# Patient Record
Sex: Female | Born: 1945 | Race: Black or African American | Hispanic: No | Smoking: Never smoker
Health system: Southern US, Community
[De-identification: ages and names within clinical notes are randomized; demographics above are authoritative.]

## PROBLEM LIST (undated history)

## (undated) DIAGNOSIS — I4891 Unspecified atrial fibrillation: Secondary | ICD-10-CM

## (undated) DIAGNOSIS — I10 Essential (primary) hypertension: Secondary | ICD-10-CM

## (undated) DIAGNOSIS — I472 Ventricular tachycardia: Secondary | ICD-10-CM

## (undated) DIAGNOSIS — I499 Cardiac arrhythmia, unspecified: Secondary | ICD-10-CM

## (undated) DIAGNOSIS — I4729 Other ventricular tachycardia: Secondary | ICD-10-CM

## (undated) HISTORY — PX: TONSILLECTOMY: SUR1361

## (undated) HISTORY — PX: UTERINE SUSPENSION: SUR1430

## (undated) HISTORY — PX: BREAST SURGERY: SHX581

## (undated) HISTORY — PX: DILATION AND CURETTAGE OF UTERUS: SHX78

## (undated) HISTORY — PX: ANTERIOR AND POSTERIOR REPAIR: SHX1172

## (undated) HISTORY — DX: Essential (primary) hypertension: I10

## (undated) HISTORY — PX: CARDIAC CATHETERIZATION: SHX172

## (undated) HISTORY — PX: BILATERAL OOPHORECTOMY: SHX1221

## (undated) HISTORY — PX: COLONOSCOPY: SHX174

## (undated) HISTORY — PX: ABDOMINAL HYSTERECTOMY: SHX81

## (undated) HISTORY — PX: GREAT TOE ARTHRODESIS, INTERPHALANGEAL JOINT: SUR55

## (undated) SURGERY — CARDIOVERSION
Anesthesia: Monitor Anesthesia Care

---

## 1999-10-26 HISTORY — PX: ATRIAL ABLATION SURGERY: SHX560

## 2002-07-16 ENCOUNTER — Encounter: Admission: RE | Admit: 2002-07-16 | Discharge: 2002-07-16 | Payer: Self-pay | Admitting: Family Medicine

## 2002-07-16 ENCOUNTER — Encounter: Payer: Self-pay | Admitting: Family Medicine

## 2002-09-14 ENCOUNTER — Ambulatory Visit (HOSPITAL_COMMUNITY): Admission: RE | Admit: 2002-09-14 | Discharge: 2002-09-14 | Payer: Self-pay | Admitting: Family Medicine

## 2002-09-14 ENCOUNTER — Encounter: Payer: Self-pay | Admitting: Family Medicine

## 2003-01-30 ENCOUNTER — Emergency Department (HOSPITAL_COMMUNITY): Admission: EM | Admit: 2003-01-30 | Discharge: 2003-01-30 | Payer: Self-pay | Admitting: Emergency Medicine

## 2003-05-01 ENCOUNTER — Encounter: Admission: RE | Admit: 2003-05-01 | Discharge: 2003-07-30 | Payer: Self-pay | Admitting: Orthopedic Surgery

## 2004-08-18 ENCOUNTER — Ambulatory Visit (HOSPITAL_COMMUNITY): Admission: RE | Admit: 2004-08-18 | Discharge: 2004-08-18 | Payer: Self-pay | Admitting: Obstetrics and Gynecology

## 2004-12-07 ENCOUNTER — Emergency Department (HOSPITAL_COMMUNITY): Admission: EM | Admit: 2004-12-07 | Discharge: 2004-12-08 | Payer: Self-pay | Admitting: Emergency Medicine

## 2005-01-13 ENCOUNTER — Ambulatory Visit (HOSPITAL_COMMUNITY): Admission: RE | Admit: 2005-01-13 | Discharge: 2005-01-13 | Payer: Self-pay | Admitting: Gastroenterology

## 2005-08-24 ENCOUNTER — Ambulatory Visit (HOSPITAL_COMMUNITY): Admission: RE | Admit: 2005-08-24 | Discharge: 2005-08-24 | Payer: Self-pay | Admitting: Obstetrics and Gynecology

## 2006-08-25 ENCOUNTER — Ambulatory Visit (HOSPITAL_COMMUNITY): Admission: RE | Admit: 2006-08-25 | Discharge: 2006-08-25 | Payer: Self-pay | Admitting: Obstetrics and Gynecology

## 2006-09-12 ENCOUNTER — Encounter: Admission: RE | Admit: 2006-09-12 | Discharge: 2006-09-12 | Payer: Self-pay | Admitting: Cardiology

## 2006-09-19 ENCOUNTER — Ambulatory Visit (HOSPITAL_COMMUNITY): Admission: RE | Admit: 2006-09-19 | Discharge: 2006-09-19 | Payer: Self-pay | Admitting: Cardiology

## 2007-08-28 ENCOUNTER — Ambulatory Visit (HOSPITAL_COMMUNITY): Admission: RE | Admit: 2007-08-28 | Discharge: 2007-08-28 | Payer: Self-pay | Admitting: Obstetrics and Gynecology

## 2008-08-28 ENCOUNTER — Ambulatory Visit (HOSPITAL_COMMUNITY): Admission: RE | Admit: 2008-08-28 | Discharge: 2008-08-28 | Payer: Self-pay | Admitting: Obstetrics and Gynecology

## 2009-02-17 ENCOUNTER — Encounter: Admission: RE | Admit: 2009-02-17 | Discharge: 2009-02-17 | Payer: Self-pay | Admitting: Obstetrics and Gynecology

## 2009-09-04 ENCOUNTER — Ambulatory Visit (HOSPITAL_COMMUNITY): Admission: RE | Admit: 2009-09-04 | Discharge: 2009-09-04 | Payer: Self-pay | Admitting: Obstetrics and Gynecology

## 2010-02-03 ENCOUNTER — Encounter (INDEPENDENT_AMBULATORY_CARE_PROVIDER_SITE_OTHER): Payer: Self-pay | Admitting: Cardiology

## 2010-02-03 ENCOUNTER — Ambulatory Visit: Admission: RE | Admit: 2010-02-03 | Discharge: 2010-02-03 | Payer: Self-pay | Admitting: Cardiology

## 2010-04-24 ENCOUNTER — Ambulatory Visit (HOSPITAL_COMMUNITY): Admission: RE | Admit: 2010-04-24 | Discharge: 2010-04-24 | Payer: Self-pay | Admitting: Obstetrics and Gynecology

## 2010-06-08 ENCOUNTER — Ambulatory Visit (HOSPITAL_COMMUNITY): Admission: RE | Admit: 2010-06-08 | Discharge: 2010-06-08 | Payer: Self-pay | Admitting: Gastroenterology

## 2010-07-03 ENCOUNTER — Ambulatory Visit (HOSPITAL_COMMUNITY): Admission: RE | Admit: 2010-07-03 | Discharge: 2010-07-03 | Payer: Self-pay | Admitting: Gastroenterology

## 2010-09-07 ENCOUNTER — Ambulatory Visit (HOSPITAL_COMMUNITY): Admission: RE | Admit: 2010-09-07 | Discharge: 2010-09-07 | Payer: Self-pay | Admitting: Obstetrics and Gynecology

## 2010-11-15 ENCOUNTER — Encounter: Payer: Self-pay | Admitting: Orthopaedic Surgery

## 2010-11-16 ENCOUNTER — Encounter: Payer: Self-pay | Admitting: Orthopaedic Surgery

## 2011-02-04 ENCOUNTER — Other Ambulatory Visit (HOSPITAL_COMMUNITY): Payer: Self-pay | Admitting: Cardiology

## 2011-02-04 DIAGNOSIS — R0602 Shortness of breath: Secondary | ICD-10-CM

## 2011-03-12 NOTE — Op Note (Signed)
Amy Adkins, MAGAR             ACCOUNT NO.:  [id number]   MEDICAL RECORD NO.:  [id number]          PATIENT TYPE:  AMB   LOCATION:  ENDO                         FACILITY:  [facility]   PHYSICIAN:  Anselmo Rod, M.D.  DATE OF BIRTH:  10-Feb-1946   DATE OF PROCEDURE:  01/13/2005  DATE OF DISCHARGE:                                 OPERATIVE REPORT   PROCEDURE PERFORMED:  Screening colonoscopy.   ENDOSCOPIST:  Anselmo Rod, M.D.   INSTRUMENT USED:  Olympus video colonoscope.   INDICATION FOR PROCEDURE:  A 65 year old African-American female underwent  screening colonoscopy for family history of colon cancer in her sister.  Rule out colonic polyps, masses, etc.   PREPROCEDURE PREPARATION:  Informed consent was procured from the patient  and the patient was fasted for eight hours prior to the procedure and  prepped with a bottle of magnesium citrate and a gallon of GoLYTELY the  night prior to the procedure.  The risks and benefits of the procedure  including a 10% miss rate of cancer and polyps were discussed with the  patient, as well.   PREPROCEDURE PHYSICAL:  The patient had stable vital signs.  Neck supple.  Chest clear to auscultation.  S1, S2 regular.  Abdomen soft with normal  bowel sounds.   DESCRIPTION OF PROCEDURE:  The patient was placed in the left lateral  decubitus position, sedated with 100 mg of Demerol and 10 mg of Versed in  slow incremental doses.  Once the patient was adequately sedated, maintained  on low-flow oxygen and continuous cardiac monitoring, the Olympus video  colonoscope was advanced from the rectum to the cecum.  The appendical  orifice and the cecal valve were clearly visualized and photographed.  No  masses, polyps, erosions, ulcerations, or diverticula were seen.  Small  internal hemorrhoids were appreciated on retroflexion of the rectum.  The  patient tolerated the procedure well without immediate complications.   IMPRESSION:  1.  Normal  colonoscopy to the cecum except for small nonbleeding internal      hemorrhoids seen on retroflexion.  2.  No masses, polyps, erosions, ulcerations, or diverticula seen.   RECOMMENDATIONS:  1.  Given the patient's family history of colon cancer in a first-degree      relative, a repeat colonoscopy has been      recommended in the next five years unless then patient develops any      abnormal symptoms in the interim.  2.  Outpatient follow-up as need arises in the future.  3.  Continue a high fiber diet as previously recommended.      JNM/MEDQ  D:  01/13/2005  T:  01/13/2005  Job:  [id number]   cc:   Maxie Better, M.D.  [address]  [place]  [place] [address]  Fax: [phone]

## 2011-03-12 NOTE — Cardiovascular Report (Signed)
NAMEHENESSY, ROHRER               ACCOUNT NO.:  [id number]   MEDICAL RECORD NO.:  [id number]          PATIENT TYPE:  OIB   LOCATION:  2899                         FACILITY:  [facility]   PHYSICIAN:  Armanda Magic, M.D.     DATE OF BIRTH:  [birth year]   DATE OF PROCEDURE:  09/19/2006  DATE OF DISCHARGE:                            CARDIAC CATHETERIZATION   PROCEDURE:  1. Left heart catheterization.  2. Coronary angiography.  3. Left ventriculography.   OPERATOR:  Armanda Magic, M.D.   INDICATIONS:  1. Dyspnea which is her anginal equivalent.  2. Abnormal Cardiolite.   COMPLICATIONS:  None.   IV ACCESS:  Via right femoral artery, 6-French sheath.   This is a very pleasant 65 year old black female, with a history of  hypertensive cardiomyopathy and AV node ablation in the past and  hypertension, now presents for cardiac catheterization due to dyspnea on  exertion, which I think may be an anginal equivalent, and an abnormal  Cardiolite.   The patient is brought to the cardiac catheterization laboratory in a  fasting nonsedated state.  Informed consent was obtained.  The patient  was connected to continuous heart rate and pulse oximetry monitoring and  intermittent blood pressure monitoring.  The right groin was prepped and  draped in a sterile fashion.  One percent Xylocaine was used for local  anesthesia.  Using modified Seldinger technique, a 6-French sheath was  placed in right femoral artery.  Under fluoroscopic guidance a 6-French  JL-4 catheter was placed in the left coronary artery.  Multiple cine  films were taken at 30 degree RAO and 40 degree LAO views.  Catheter was  then exchanged out over a guidewire for a 6-French JR-4 catheter which  was placed under fluoroscopic guidance into the right coronary artery.  Multiple cine films were taken at 30 degree RAO and 40 degree LAO views.  This catheter was exchanged out over a guidewire for a 6-French angled  pigtail catheter  which was placed under fluoroscopic guidance into the  left ventricular cavity.  Left ventriculography was performed in the 30  degree RAO view using total of 30 mL of contrast at 15 mL per second.  Catheter was then pulled back across the aortic valve with no  significant gradient noted.  At the end of the procedure, all catheters  and sheaths were removed.  Manual compression was performed until  adequate hemostasis was obtained.  The patient was transferred back to  the room in stable condition.   RESULTS:  Left main coronary artery is widely patent and trifurcates  into the left anterior descending artery, left circumflex artery and  ramus branch.   Left anterior descending artery is widely patent throughout its course,  the apex giving rise to a first diagonal branch which is widely patent.   The ramus branch is large and widely patent throughout its course  distally trifurcating at 3 daughter branches, all of which are widely  patent.   Left circumflex is widely patent throughout its course in the AV groove  giving rise to a first small obtuse marginal branch,  which is widely  patent, and then gives rise to a large second obtuse marginal branch  which trifurcates into 3 daughter branches, all of which are widely  patent.   The right coronary artery is widely patent and bifurcates distally into  a posterior descending artery and posterior lateral artery, both of  which are widely patent.   Left ventriculography shows normal LV systolic function at 55%. Left  ventricular and aortic pressures as well as LVEDP were inaccurate  because of significant amount of PVCs.   ASSESSMENT:  1. Normal coronary arteries.  2. Normal left ventricular function.  3. Noncardiac chest pain, question possibly an arrhythmia causing her      shortness of breath.  She did have several episodes of what      appeared to be an atrial tachycardia or supraventricular      tachycardia during the  procedure.   PLAN:  1. Discharge to home after IV fluid and bedrest.  2. Follow-up with me in 3 to 4 weeks for groin check.  3. Will get an outpatient event monitor to see if she is having SVT      that correlates with her shortness of breath.      Armanda Magic, M.D.  Electronically Signed     TT/MEDQ  D:  09/19/2006  T:  09/19/2006  Job:  [id number]   cc:   Maxie Better, M.D.

## 2011-03-12 NOTE — H&P (Signed)
NAME:  Amy Adkins, Amy Adkins                 ACCOUNT NO.:  griff   MEDICAL RECORD NO.:  [id number]          PATIENT TYPE:  OIB   LOCATION:  2899                         FACILITY:  [facility]   PHYSICIAN:  Tylene Fantasia, PA    DATE OF BIRTH:  Nov 12, 1945   DATE OF ADMISSION:  09/19/2006  DATE OF DISCHARGE:                                HISTORY & PHYSICAL   CHIEF COMPLAINT:  Chest tightness.   HISTORY OF PRESENT ILLNESS:  The patient  is a 65 year old African American  female with a known history of palpitations with PVC'S and capsule PAC'S on  Holter monitor, hypertension, prior AV nodal ablation, and normal coronary  arteries by prior catheterization 7 years ago.  She presented to the [facility]  cardiology office for a stress Cardiolite secondary to chest tightness.  Stress Cardiolite revealed questionable septal ischemia at the base versus  breast attenuation artifact.  She is being admitted today as an outpatient  for diagnostic cardiac cath with possible PCI.  She denies chest tightness,  chest pain, shortness of breath or diaphoresis.   PAST MEDICAL HISTORY:  1. Normal coronary arteries by prior catheterization 7 years ago.  2. Hypertension.  3. History of prior AV nodal abrasion.  4. History of palpitations with PVC's and PAC 's on Holter monitor.  5. Hypertensive cardiomyopathy, EF normalized at 61% by MUGA in 2005.  6. Mild LV dysfunction with EF of 43% via 2-D echocardiogram 08/2006.  7. Chronic shortness of breath of unclear etiology.   ALLERGIES:  No known drug allergies.   CURRENT MEDICATIONS:  1. Aspirin 81 mg daily.  2. Lasix 20 mg daily.  3. Premarin 0.45 mg daily.  4. Calcium 1200 mg daily.  5. Zinc 1 tablet daily.  6. Omega 3 fatty acids 1 tablet twice daily.  7. Zestril 20 mg daily.  8. Toprol XL 50 mg daily.   FAMILY HISTORY:  1. Father deceased age 72, MI secondary to a gunshot wound.  2. Mother deceased age 57, hypertension, CABG, ulcerative colitis with  multi system organ failure.   SOCIAL HISTORY:  The patient denies tobacco or illicit drug use.  She admits  to occasional alcohol use.   REVIEW OF SYSTEMS:  All other systems reviewed are negative other than what  is stated in the HPI.   PHYSICAL EXAMINATION:  GENERAL:  A 65 year old female, anxious appearing but  pleasant and cooperative, NAD.  VITALS:  Temperature 96.9, pulse 62, blood pressure 122/67, O2 saturations  99% over room air.  Height 5 feet 6 inches, weight 140 pounds.  HEENT: Unremarkable.  NECK:  Supple without JVD or carotid bruits, bilaterally.  PULMONARY:  Breath sounds are equal and clear to auscultation, bilaterally.  No use accessory muscles.  CV: Regular rate and rhythm.  Normal S1 and S2 without murmurs, gallops,  clicks or rubs.  ABDOMEN:  Soft, nontender, nondistended with active bowel  sounds.  No masses, organomegaly, or bilateral bruits.  EXTREMITIES:  No edema.  DP, PT, and femoral pulses 2+/2, bilaterally.  SKIN:  Warm and dry without rashes or lesions.  PSYCH:  Normal mood and affect.  NEURO:  No focal motor or sensory deficits.   ASSESSMENT AND PLAN:  1. Chest tightness, resolved.  2. Abnormal stress Cardiolite in 08/2006.  3. Diagnostic cardiac catheterization with possible PCI.  4. The cardiac catheterization procedure, risks, and potential      complications have been explained by Dr. Mayford Knife including MI, CVA,      death, renal insufficiency, dye allergy, and vascular complications.      The patient understands and wishes to proceed.      Tylene Fantasia, [place]     RDM/MEDQ  D:  09/19/2006  T:  09/19/2006  Job:  [id number]   cc:   Gretta Arab. Valentina Lucks, M.D.  Maxie Better, M.D.

## 2011-03-26 ENCOUNTER — Other Ambulatory Visit (HOSPITAL_COMMUNITY): Payer: Self-pay

## 2011-04-06 ENCOUNTER — Encounter (HOSPITAL_COMMUNITY)
Admission: RE | Admit: 2011-04-06 | Discharge: 2011-04-06 | Disposition: A | Discharge status: Home / Self Care | Payer: 59 | Source: Ambulatory Visit | Attending: Cardiology | Admitting: Cardiology

## 2011-04-06 ENCOUNTER — Ambulatory Visit (HOSPITAL_COMMUNITY): Payer: Self-pay

## 2011-04-06 DIAGNOSIS — R0602 Shortness of breath: Secondary | ICD-10-CM | POA: Insufficient documentation

## 2011-04-06 MED ORDER — TECHNETIUM TC 99M TETROFOSMIN IV KIT
10.0000 | PACK | Freq: Once | INTRAVENOUS | Status: AC | PRN
  Administered 2011-04-06: 10 via INTRAVENOUS

## 2011-04-06 MED ORDER — TECHNETIUM TC 99M TETROFOSMIN IV KIT
30.0000 | PACK | Freq: Once | INTRAVENOUS | Status: AC | PRN
  Administered 2011-04-06: 30 via INTRAVENOUS

## 2011-04-13 ENCOUNTER — Other Ambulatory Visit (HOSPITAL_COMMUNITY): Payer: Self-pay | Admitting: Cardiology

## 2011-04-13 ENCOUNTER — Encounter: Payer: Self-pay | Admitting: Cardiology

## 2011-04-13 DIAGNOSIS — R9439 Abnormal result of other cardiovascular function study: Secondary | ICD-10-CM

## 2011-04-14 ENCOUNTER — Telehealth: Payer: Self-pay | Admitting: Cardiology

## 2011-04-14 NOTE — Telephone Encounter (Signed)
[name] from Dr. Mayford Knife office called and stated that Amy Adkins does not want to proceed with CTA at this time.

## 2011-04-19 ENCOUNTER — Other Ambulatory Visit (HOSPITAL_COMMUNITY): Payer: Self-pay

## 2011-04-21 ENCOUNTER — Inpatient Hospital Stay (HOSPITAL_COMMUNITY): Admission: RE | Admit: 2011-04-21 | Payer: 59 | Source: Ambulatory Visit

## 2011-05-05 ENCOUNTER — Encounter: Payer: Self-pay | Admitting: Cardiology

## 2011-05-06 ENCOUNTER — Ambulatory Visit (INDEPENDENT_AMBULATORY_CARE_PROVIDER_SITE_OTHER): Payer: 59 | Admitting: Cardiology

## 2011-05-06 ENCOUNTER — Encounter: Payer: Self-pay | Admitting: Cardiology

## 2011-05-06 VITALS — BP 124/80 | HR 72 | Resp 14 | Ht 66.0 in | Wt 162.0 lb

## 2011-05-06 DIAGNOSIS — R0600 Dyspnea, unspecified: Secondary | ICD-10-CM | POA: Insufficient documentation

## 2011-05-06 DIAGNOSIS — I119 Hypertensive heart disease without heart failure: Secondary | ICD-10-CM | POA: Insufficient documentation

## 2011-05-06 DIAGNOSIS — R0602 Shortness of breath: Secondary | ICD-10-CM

## 2011-05-06 DIAGNOSIS — R079 Chest pain, unspecified: Secondary | ICD-10-CM | POA: Insufficient documentation

## 2011-05-06 DIAGNOSIS — I428 Other cardiomyopathies: Secondary | ICD-10-CM

## 2011-05-06 MED ORDER — CARVEDILOL 6.25 MG PO TABS: 6.2500 mg | ORAL_TABLET | Freq: Two times a day (BID) | ORAL | Status: DC

## 2011-05-06 NOTE — Assessment & Plan Note (Signed)
In retrospect, I suspect this is due to long-standing hypertension. The fact that her blood pressure is good today and the fact that her left ventricular function has not declined over the past 5 years is encouraging. The low ejection fraction on the nuclear study is probably falsely low.  I do not think she needs a cardiac catheterization at the present time. The findings on the 2 nuclear studies in 2007 2012 are not totally dissimilar. Her symptoms also are not consistent with ischemia or angina.  I think the dyspnea which is episodic and usually at rest is probably related to premature beats and/or anxiety. We had long talk about this today I think she understands this. I want to make sure she is good blood pressure control. Also think carvedilol would be a better long-term beta blocker for preventing the onset of heart failure. I have changed her to carvedilol 6.25 mg p.o. B.i.d.. She will check her blood  Pressure various times a day and bring those recordings to [place] in about 4-6 weeks. We will make further recommendations at that time.

## 2011-05-06 NOTE — Patient Instructions (Signed)
Your physician has recommended you make the following change in your medication: Start Carvedilol  Take 1/2 the dose of your Toprol ( Metoprolol) today, tomorrow, and Saturday.  Then discontinue use.   Your physician recommends that you schedule a follow-up appointment in: 6 weeks with Dr. Daleen Squibb  Your physician has requested that you check your blood pressure 3 times a week at various times of the day and  record your blood pressure readings at home. Please use the same machine at the same time of day to check your readings and record them to bring to your follow-up visit.

## 2011-05-06 NOTE — Progress Notes (Signed)
HPI Amy Adkins is a delightful 65 year old African American female referred by Dr. Cherly Hensen for a second opinion concerning shortness or breath or dyspnea, chest discomfort, and hypertension.  She has had hypertension for at least 20 years. She's had a history of chest discomfort in the past and 2007 had a stress nuclear study that showed a basal inferior reversible change. Her ejection fraction at that time by echocardiography was 45%. She underwent cardiac catheterization which showed normal coronary arteries.  She's been having chest discomfort at rest. It is over her left breast. It does not radiate. It is not associated with any other symptoms. It is clearly not exertion related.  She also has episodes of not being able to catch her breath for periods of time. Generally this is seconds to at most a minute or so. It is usually at rest. It is not associated with exertion though climbing a flight of steps can get her winded. She does not have any associated other symptoms with it including nausea, vomiting, diaphoresis. At times she'll fill her heart skip a beat and she will fill a need to take a deep breath. She's had a Holter monitor and the past that shows PVCs and PACs.  She denies any edema or fluid retention ever.  She recently had another stress nuclear study because of the above symptoms which showed an ejection fraction of 35% with suspected scarring in the septum with 11% reduction in activity and one of the septal segments with mild septal dyskinesia with diffuse hypokinesia. The was no obvious ischemia. Comparing the 2 scans they look very similar at least by the way they were interpreted. I do not have the original images.  She was advised to have another catheterization. She declined and asked for second opinion. He  Of note, she had an echocardiogram in April of this year. It shows an ejection fraction of 45-50% with akinesia of the basal anteroseptal myocardium. Left ventricular  chamber size was 49 mm which is normal. Left ventricular end-diastolic size was 44 mm which is slightly increased. There was no significant valvular heart disease with only trivial mitral regurgitation. Right-sided structures and function were normal.  An EKG today is identical to one of 2007 as well as one and was most recently done in April of this year. It shows normal sinus rhythm with nonspecific ST-T wave changes mostly in the inferolateral leads. Chest slight conduction delay in V2.  I spent 45 minutes reviewing the records and another 15 minutes discussing these with the patient.   Past Medical History  Diagnosis Date  . Hypertension   . Palpitations     No past surgical history on file.  Family History  Problem Relation Age of Onset  . Heart attack Father 19    deceased  . Hypertension Mother 60    CAB multi system organ failure    History   Social History  . Marital Status: Divorced    Spouse Name: N/A    Number of Children: N/A  . Years of Education: N/A   Occupational History  . Not on file.   Social History Main Topics  . Smoking status: Never Smoker   . Smokeless tobacco: Not on file  . Alcohol Use: No     occasional  . Drug Use: No  . Sexually Active: Not on file   Other Topics Concern  . Not on file   Social History Narrative  . No narrative on file    No Known  Allergies  Current Outpatient Prescriptions  Medication Sig Dispense Refill  . aspirin 81 MG tablet Take 81 mg by mouth daily.        Marland Kitchen estrogens, conjugated, (PREMARIN) 0.45 MG tablet 1 tab daily for 21 days then do not take for 7 days.      Marland Kitchen lisinopril (PRINIVIL,ZESTRIL) 20 MG tablet Take 20 mg by mouth daily.        . metoprolol (TOPROL-XL) 50 MG 24 hr tablet Take 50 mg by mouth daily.        . Multiple Vitamin (MULTIVITAMIN) tablet Take 1 tablet by mouth daily.          ROS Negative other than HPI.   PE General Appearance: well developed, well nourished in no acute  distress HEENT: symmetrical face, PERRLA, good dentition  Neck: no JVD, thyromegaly, or adenopathy, trachea midline Chest: symmetric without deformity Cardiac: PMI non-displaced, RRR, normal S1, S2, no gallop or murmur Lung: clear to ausculation and percussion Vascular: all pulses full without bruits  Abdominal: nondistended, nontender, good bowel sounds, no HSM, no bruits Extremities: no cyanosis, clubbing or edema, no sign of DVT, no varicosities  Skin: normal color, no rashes Neuro: alert and oriented x 3, non-focal Pysch: normal affect Filed Vitals:   05/06/11 1412  BP: 124/80  Pulse: 72  Resp: 14  Height: 5\' 6"  (1.676 m)  Weight: 162 lb (73.483 kg)    EKG  Labs and Studies Reviewed.   No results found for this basename: WBC, HGB, HCT, MCV, PLT      Chemistry   No results found for this basename: NA, K, CL, CO2, BUN, CREATININE, GLU   No results found for this basename: CALCIUM, ALKPHOS, AST, ALT, BILITOT       No results found for this basename: CHOL   No results found for this basename: HDL   No results found for this basename: LDLCALC   No results found for this basename: TRIG   No results found for this basename: CHOLHDL   No results found for this basename: HGBA1C   No results found for this basename: ALT, AST, GGT, ALKPHOS, BILITOT   No results found for this basename: TSH

## 2011-05-21 ENCOUNTER — Encounter: Payer: Self-pay | Admitting: Cardiology

## 2011-05-27 ENCOUNTER — Encounter: Payer: Self-pay | Admitting: Cardiology

## 2011-06-17 ENCOUNTER — Encounter: Payer: Self-pay | Admitting: Cardiology

## 2011-06-17 ENCOUNTER — Ambulatory Visit (INDEPENDENT_AMBULATORY_CARE_PROVIDER_SITE_OTHER): Payer: 59 | Admitting: Cardiology

## 2011-06-17 VITALS — BP 139/83 | HR 62 | Ht 66.0 in | Wt 162.8 lb

## 2011-06-17 DIAGNOSIS — I119 Hypertensive heart disease without heart failure: Secondary | ICD-10-CM

## 2011-06-17 DIAGNOSIS — I428 Other cardiomyopathies: Secondary | ICD-10-CM

## 2011-06-17 DIAGNOSIS — R002 Palpitations: Secondary | ICD-10-CM | POA: Insufficient documentation

## 2011-06-17 MED ORDER — CARVEDILOL 12.5 MG PO TABS: 12.5000 mg | ORAL_TABLET | Freq: Two times a day (BID) | ORAL | Status: DC

## 2011-06-17 NOTE — Assessment & Plan Note (Signed)
I will increase her carvedilol 12/2 mg twice a day. I suspect this will help lower her pressure further and hopefully decrease her palpitations. I made it clear to her that we would most likely not get rid of all of them. She is  Understanding of this. I will see her back in 6 weeks.

## 2011-06-17 NOTE — Patient Instructions (Signed)
Your physician has recommended you make the following change in your medication:   Increase Carvedilol  Your physician recommends that you schedule a follow-up appointment in: 6-8 weeks with Dr. Daleen Squibb

## 2011-06-17 NOTE — Progress Notes (Signed)
HPI Amy Adkins comes in for a close followup of her history of hypertension, palpitations, episodic dyspnea, and hypertensive cardiomyopathy.  Please refer to my previous initial consultation.  I placed her on carvedilol 6.25 b.i.d. Her blood pressures have been running about 130/70 at home heart rate running in the mid 60s to low 70s. He still has occasional palpitations and also the spells where she feels briefly lightheaded. She has not had syncope or presyncope. She's had no angina.  She has a history of SVT and had an ablation in 2001 in [place]. Symptoms she is having at present is nothing like that.  Her EKG today shows normal sinus rhythm, normal intervals, and LVH. No significant change. Past Medical History  Diagnosis Date  . Hypertension   . Palpitations     No past surgical history on file.  Family History  Problem Relation Age of Onset  . Heart attack Father 56    deceased  . Hypertension Mother 79    CAB multi system organ failure    History   Social History  . Marital Status: Divorced    Spouse Name: N/A    Number of Children: N/A  . Years of Education: N/A   Occupational History  . Not on file.   Social History Main Topics  . Smoking status: Never Smoker   . Smokeless tobacco: Not on file  . Alcohol Use: No     occasional  . Drug Use: No  . Sexually Active: Not on file   Other Topics Concern  . Not on file   Social History Narrative  . No narrative on file    No Known Allergies  Current Outpatient Prescriptions  Medication Sig Dispense Refill  . aspirin 81 MG tablet Take 81 mg by mouth daily.        . carvedilol (COREG) 6.25 MG tablet Take 1 tablet (6.25 mg total) by mouth 2 (two) times daily with a meal.  180 tablet  3  . estrogens, conjugated, (PREMARIN) 0.45 MG tablet Take 0.45 mg by mouth daily.       Marland Kitchen lisinopril (PRINIVIL,ZESTRIL) 20 MG tablet Take 20 mg by mouth daily.        . Multiple Vitamin (MULTIVITAMIN) tablet Take 1 tablet  by mouth daily.          ROS Negative other than HPI.   PE No change. Filed Vitals:   06/17/11 1608  BP: 139/83  Pulse: 62  Height: 5\' 6"  (1.676 m)  Weight: 162 lb 12.8 oz (73.846 kg)    EKG  Labs and Studies Reviewed.   No results found for this basename: WBC, HGB, HCT, MCV, PLT      Chemistry   No results found for this basename: NA, K, CL, CO2, BUN, CREATININE, GLU   No results found for this basename: CALCIUM, ALKPHOS, AST, ALT, BILITOT       No results found for this basename: CHOL   No results found for this basename: HDL   No results found for this basename: LDLCALC   No results found for this basename: TRIG   No results found for this basename: CHOLHDL   No results found for this basename: HGBA1C   No results found for this basename: ALT, AST, GGT, ALKPHOS, BILITOT   No results found for this basename: TSH

## 2011-07-12 ENCOUNTER — Other Ambulatory Visit (HOSPITAL_COMMUNITY): Payer: Self-pay | Admitting: Obstetrics and Gynecology

## 2011-07-12 DIAGNOSIS — Z1231 Encounter for screening mammogram for malignant neoplasm of breast: Secondary | ICD-10-CM

## 2011-08-16 ENCOUNTER — Ambulatory Visit: Payer: 59 | Admitting: Cardiology

## 2011-09-09 ENCOUNTER — Ambulatory Visit (HOSPITAL_COMMUNITY)
Admission: RE | Admit: 2011-09-09 | Discharge: 2011-09-09 | Disposition: A | Discharge status: Home / Self Care | Payer: 59 | Source: Ambulatory Visit | Attending: Obstetrics and Gynecology | Admitting: Obstetrics and Gynecology

## 2011-09-09 DIAGNOSIS — Z1231 Encounter for screening mammogram for malignant neoplasm of breast: Secondary | ICD-10-CM | POA: Insufficient documentation

## 2011-10-12 ENCOUNTER — Telehealth: Payer: Self-pay | Admitting: Cardiology

## 2011-10-12 NOTE — Telephone Encounter (Signed)
Pt needs a refill on lisinopril 20mg  qd called into [place] [facility] at [facility]

## 2011-10-12 NOTE — Telephone Encounter (Signed)
New msg Pt said she is having chest discomfort under her left breast sent to triage

## 2011-10-12 NOTE — Telephone Encounter (Signed)
Pt insisted on seeing Dr wall tomorrow.  Advised pt that if she continues to have chest pain she should go the the Emergency Room.  Pt stated that she would not go to the ER but see Dr. Daleen Squibb tomorrow instead.  Appointment made.

## 2011-10-13 ENCOUNTER — Encounter: Payer: Self-pay | Admitting: Cardiology

## 2011-10-13 ENCOUNTER — Ambulatory Visit (INDEPENDENT_AMBULATORY_CARE_PROVIDER_SITE_OTHER): Payer: 59 | Admitting: Cardiology

## 2011-10-13 DIAGNOSIS — R079 Chest pain, unspecified: Secondary | ICD-10-CM

## 2011-10-13 MED ORDER — LISINOPRIL 20 MG PO TABS: 20.0000 mg | ORAL_TABLET | Freq: Every day | ORAL | Status: DC

## 2011-10-13 NOTE — Assessment & Plan Note (Signed)
Noncardiac. This is clearly  Musculoskeletal by history and exam. Advised to use nonsteroidals, heat, and avoid exacerbating activity.

## 2011-10-13 NOTE — Progress Notes (Signed)
HPI Amy Adkins comes in today for sharp left upper chest discomfort which lasted all day on Tuesday. She felt a slight discomfort in her left arm low elbow as well. Discomfort was not associated with any shortness of breath or cough. Discomfort is not made worse by taking a deep breath. She denies any fever chills or sweats. She denies hemoptysis.  She denies exertional chest discomfort or angina.  Past Medical History  Diagnosis Date  . Hypertension   . Palpitations   . SVT (supraventricular tachycardia)     Current Outpatient Prescriptions  Medication Sig Dispense Refill  . aspirin 81 MG tablet Take 81 mg by mouth daily.        . carvedilol (COREG) 12.5 MG tablet Take 1 tablet (12.5 mg total) by mouth 2 (two) times daily with a meal.  180 tablet  3  . estrogens, conjugated, (PREMARIN) 0.45 MG tablet Take 0.45 mg by mouth daily.       Marland Kitchen lisinopril (PRINIVIL,ZESTRIL) 20 MG tablet Take 20 mg by mouth daily.       . Multiple Vitamin (MULTIVITAMIN) tablet Take 1 tablet by mouth daily.          No Known Allergies  Family History  Problem Relation Age of Onset  . Heart attack Father 22    deceased  . Hypertension Mother 31    CAB multi system organ failure    History   Social History  . Marital Status: Divorced    Spouse Name: N/A    Number of Children: N/A  . Years of Education: N/A   Occupational History  . Not on file.   Social History Main Topics  . Smoking status: Never Smoker   . Smokeless tobacco: Not on file  . Alcohol Use: No     occasional  . Drug Use: No  . Sexually Active: Not on file   Other Topics Concern  . Not on file   Social History Narrative  . No narrative on file    ROS ALL NEGATIVE EXCEPT THOSE NOTED IN HPI  PE  General Appearance: well developed, well nourished in no acute distress HEENT: symmetrical face, PERRLA, good dentition  Neck: no JVD, thyromegaly, or adenopathy, trachea midline Chest: symmetric without deformity, chest Ely Ballen  tenderness across the left upper chest, movement reproduces her discomfort. Cardiac: PMI non-displaced, RRR, normal S1, S2, no gallop or murmur, no rub Lung: clear to ausculation and percussion, no rub Vascular: all pulses full without bruits  Abdominal: nondistended, nontender, good bowel sounds, no HSM, no bruits Extremities: no cyanosis, clubbing or edema, no sign of DVT, no varicosities  Skin: normal color, no rashes Neuro: alert and oriented x 3, non-focal Pysch: normal affect  EKG Normal sinus rhythm with ST segment changes inferolaterally, unchanged from before. She has moderate criteria for LVH BMET No results found for this basename: na, k, cl, co2, glucose, bun, creatinine, calcium, gfrnonaa, gfraa    Lipid Panel  No results found for this basename: chol, trig, hdl, cholhdl, vldl, ldlcalc    CBC No results found for this basename: wbc, rbc, hgb, hct, plt, mcv, mch, mchc, rdw, neutrabs, lymphsabs, monoabs, eosabs, basosabs

## 2011-10-13 NOTE — Progress Notes (Signed)
Addended by: Mylo Red F on: 10/13/2011 03:42 PM   Modules accepted: Orders

## 2011-10-13 NOTE — Patient Instructions (Signed)
Dr Daleen Squibb has recommended that you take Advil 400mg  three times a day and at bedtime with food for your chest wall discomfort. You may also use heat to the affected area. Please try to avoid activities that cause this discomfort.  Your physician wants you to follow-up in: 1 year with Dr. Daleen Squibb. You will receive a reminder letter in the mail two months in advance. If you don't receive a letter, please call our office to schedule the follow-up appointment.

## 2012-03-02 ENCOUNTER — Other Ambulatory Visit (HOSPITAL_COMMUNITY): Payer: Self-pay | Admitting: Obstetrics and Gynecology

## 2012-03-02 ENCOUNTER — Ambulatory Visit (HOSPITAL_COMMUNITY)
Admission: RE | Admit: 2012-03-02 | Discharge: 2012-03-02 | Disposition: A | Discharge status: Home / Self Care | Payer: 59 | Source: Ambulatory Visit | Attending: Obstetrics and Gynecology | Admitting: Obstetrics and Gynecology

## 2012-03-02 DIAGNOSIS — R0989 Other specified symptoms and signs involving the circulatory and respiratory systems: Secondary | ICD-10-CM | POA: Insufficient documentation

## 2012-03-02 DIAGNOSIS — R05 Cough: Secondary | ICD-10-CM | POA: Insufficient documentation

## 2012-03-02 DIAGNOSIS — R059 Cough, unspecified: Secondary | ICD-10-CM | POA: Insufficient documentation

## 2012-07-18 ENCOUNTER — Other Ambulatory Visit: Payer: Self-pay | Admitting: Cardiology

## 2012-07-31 ENCOUNTER — Other Ambulatory Visit (HOSPITAL_COMMUNITY): Payer: Self-pay | Admitting: Obstetrics and Gynecology

## 2012-07-31 DIAGNOSIS — Z1231 Encounter for screening mammogram for malignant neoplasm of breast: Secondary | ICD-10-CM

## 2012-09-11 ENCOUNTER — Ambulatory Visit (HOSPITAL_COMMUNITY)
Admission: RE | Admit: 2012-09-11 | Discharge: 2012-09-11 | Disposition: A | Discharge status: Home / Self Care | Payer: 59 | Source: Ambulatory Visit | Attending: Obstetrics and Gynecology | Admitting: Obstetrics and Gynecology

## 2012-09-11 DIAGNOSIS — Z1231 Encounter for screening mammogram for malignant neoplasm of breast: Secondary | ICD-10-CM | POA: Insufficient documentation

## 2012-09-27 ENCOUNTER — Encounter: Payer: Self-pay | Admitting: *Deleted

## 2012-09-27 ENCOUNTER — Ambulatory Visit (INDEPENDENT_AMBULATORY_CARE_PROVIDER_SITE_OTHER): Payer: 59 | Admitting: Cardiology

## 2012-09-27 ENCOUNTER — Encounter: Payer: Self-pay | Admitting: Cardiology

## 2012-09-27 VITALS — BP 160/90 | HR 55 | Ht 66.0 in | Wt 156.0 lb

## 2012-09-27 DIAGNOSIS — R0789 Other chest pain: Secondary | ICD-10-CM

## 2012-09-27 DIAGNOSIS — R002 Palpitations: Secondary | ICD-10-CM

## 2012-09-27 DIAGNOSIS — R0989 Other specified symptoms and signs involving the circulatory and respiratory systems: Secondary | ICD-10-CM

## 2012-09-27 DIAGNOSIS — I119 Hypertensive heart disease without heart failure: Secondary | ICD-10-CM

## 2012-09-27 DIAGNOSIS — I43 Cardiomyopathy in diseases classified elsewhere: Secondary | ICD-10-CM

## 2012-09-27 DIAGNOSIS — R0600 Dyspnea, unspecified: Secondary | ICD-10-CM

## 2012-09-27 DIAGNOSIS — I428 Other cardiomyopathies: Secondary | ICD-10-CM

## 2012-09-27 DIAGNOSIS — Z01818 Encounter for other preprocedural examination: Secondary | ICD-10-CM

## 2012-09-27 NOTE — Patient Instructions (Addendum)
Your physician recommends that you continue on your current medications as directed. Please refer to the Current Medication list given to you today.   Your physician wants you to follow-up in: 1 year with Dr. Wall. You will receive a reminder letter in the mail two months in advance. If you don't receive a letter, please call our office to schedule the follow-up appointment.  

## 2012-09-27 NOTE — Assessment & Plan Note (Signed)
She is stable clinically. Note sent with her for clearance for surgery at low cardiovascular risk. I will see her back in the office in a year. Continue current medical therapy. Encouraged to exercise more.

## 2012-09-27 NOTE — Progress Notes (Signed)
HPI Amy Adkins returns today for preoperative clearance for bilateral breast reduction. Arranged for next week with Dr. Odis Luster.  She says that she is having no chest discomfort consistent with angina. She has a previous normal cardiac catheterization in the past. She does have atypical chest pain that occurs both at rest and with exertion. This is unchanged from her last visit. In addition her blood pressure has been mostly well controlled. She has occasional palpitations but this is unusual.  She is compliant with her medications. She denies orthopnea, PND or edema.  Past Medical History  Diagnosis Date  . Hypertension   . Palpitations   . SVT (supraventricular tachycardia)     Current Outpatient Prescriptions  Medication Sig Dispense Refill  . aspirin 81 MG tablet Take 81 mg by mouth daily.        . carvedilol (COREG) 12.5 MG tablet TAKE 1 TABLET BY MOUTH 2 TIMES DAILY WITH A MEAL.  180 tablet  2  . estrogens, conjugated, (PREMARIN) 0.45 MG tablet Take 0.45 mg by mouth daily.       Marland Kitchen lisinopril (PRINIVIL,ZESTRIL) 20 MG tablet Take 1 tablet (20 mg total) by mouth daily.  90 tablet  3  . Multiple Vitamin (MULTIVITAMIN) tablet Take 1 tablet by mouth daily.          No Known Allergies  Family History  Problem Relation Age of Onset  . Heart attack Father 34    deceased  . Hypertension Mother 21    CAB multi system organ failure    History   Social History  . Marital Status: Divorced    Spouse Name: N/A    Number of Children: N/A  . Years of Education: N/A   Occupational History  . Not on file.   Social History Main Topics  . Smoking status: Never Smoker   . Smokeless tobacco: Not on file  . Alcohol Use: No     Comment: occasional  . Drug Use: No  . Sexually Active: Not on file   Other Topics Concern  . Not on file   Social History Narrative  . No narrative on file    ROS ALL NEGATIVE EXCEPT THOSE NOTED IN HPI  PE  General Appearance: well developed, well  nourished in no acute distress HEENT: symmetrical face, PERRLA, good dentition  Neck: no JVD, thyromegaly, or adenopathy, trachea midline Chest: symmetric without deformity Cardiac: PMI non-displaced, RRR, normal S1, S2, no gallop or murmur Lung: clear to ausculation and percussion Vascular: all pulses full without bruits  Abdominal: nondistended, nontender, good bowel sounds, no HSM, no bruits Extremities: no cyanosis, clubbing or edema, no sign of DVT, no varicosities  Skin: normal color, no rashes Neuro: alert and oriented x 3, non-focal Pysch: normal affect  EKG  Normal sinus rhythm with nonspecific ST changes, no acute changes.  BMET No results found for this basename: na, k, cl, co2, glucose, bun, creatinine, calcium, gfrnonaa, gfraa    Lipid Panel  No results found for this basename: chol, trig, hdl, cholhdl, vldl, ldlcalc    CBC No results found for this basename: wbc, rbc, hgb, hct, plt, mcv, mch, mchc, rdw, neutrabs, lymphsabs, monoabs, eosabs, basosabs

## 2012-10-04 ENCOUNTER — Encounter (HOSPITAL_BASED_OUTPATIENT_CLINIC_OR_DEPARTMENT_OTHER): Payer: Self-pay | Admitting: *Deleted

## 2012-10-04 NOTE — Progress Notes (Signed)
Had cardiac clearance from dr Tarry Kos womens in OR Will need istat To bring all meds and overnight bag

## 2012-10-05 ENCOUNTER — Encounter (HOSPITAL_BASED_OUTPATIENT_CLINIC_OR_DEPARTMENT_OTHER)
Admission: RE | Disposition: A | Discharge status: Home / Self Care | Payer: Self-pay | Source: Ambulatory Visit | Attending: Plastic Surgery

## 2012-10-05 ENCOUNTER — Encounter (HOSPITAL_BASED_OUTPATIENT_CLINIC_OR_DEPARTMENT_OTHER): Payer: Self-pay | Admitting: *Deleted

## 2012-10-05 ENCOUNTER — Ambulatory Visit (HOSPITAL_BASED_OUTPATIENT_CLINIC_OR_DEPARTMENT_OTHER)
Admission: RE | Admit: 2012-10-05 | Discharge: 2012-10-06 | Disposition: A | Discharge status: Home / Self Care | Payer: 59 | Source: Ambulatory Visit | Attending: Plastic Surgery | Admitting: Plastic Surgery

## 2012-10-05 ENCOUNTER — Ambulatory Visit (HOSPITAL_BASED_OUTPATIENT_CLINIC_OR_DEPARTMENT_OTHER): Payer: 59 | Admitting: *Deleted

## 2012-10-05 ENCOUNTER — Encounter (HOSPITAL_BASED_OUTPATIENT_CLINIC_OR_DEPARTMENT_OTHER): Payer: Self-pay

## 2012-10-05 DIAGNOSIS — M542 Cervicalgia: Secondary | ICD-10-CM | POA: Insufficient documentation

## 2012-10-05 DIAGNOSIS — N62 Hypertrophy of breast: Secondary | ICD-10-CM | POA: Insufficient documentation

## 2012-10-05 DIAGNOSIS — I10 Essential (primary) hypertension: Secondary | ICD-10-CM | POA: Insufficient documentation

## 2012-10-05 DIAGNOSIS — M546 Pain in thoracic spine: Secondary | ICD-10-CM | POA: Insufficient documentation

## 2012-10-05 DIAGNOSIS — I498 Other specified cardiac arrhythmias: Secondary | ICD-10-CM | POA: Insufficient documentation

## 2012-10-05 HISTORY — PX: BREAST REDUCTION SURGERY: SHX8

## 2012-10-05 LAB — POCT I-STAT, CHEM 8
Chloride: 110 mEq/L (ref 96–112)
Glucose, Bld: 79 mg/dL (ref 70–99)
HCT: 37 % (ref 36.0–46.0)
Potassium: 3.8 mEq/L (ref 3.5–5.1)
Sodium: 143 mEq/L (ref 135–145)

## 2012-10-05 SURGERY — MAMMOPLASTY, REDUCTION
Anesthesia: General | Site: Breast | Laterality: Bilateral | Wound class: Clean

## 2012-10-05 MED ORDER — CEFAZOLIN SODIUM-DEXTROSE 2-3 GM-% IV SOLR
2.0000 g | INTRAVENOUS | Status: AC
  Administered 2012-10-05: 2 g via INTRAVENOUS

## 2012-10-05 MED ORDER — PROMETHAZINE HCL 25 MG/ML IJ SOLN: 6.2500 mg | INTRAMUSCULAR | Status: DC | PRN

## 2012-10-05 MED ORDER — DEXAMETHASONE SODIUM PHOSPHATE 4 MG/ML IJ SOLN
INTRAMUSCULAR | Status: DC | PRN
  Administered 2012-10-05: 10 mg via INTRAVENOUS

## 2012-10-05 MED ORDER — ONDANSETRON HCL 4 MG/2ML IJ SOLN
INTRAMUSCULAR | Status: DC | PRN
  Administered 2012-10-05: 4 mg via INTRAVENOUS

## 2012-10-05 MED ORDER — NEOSTIGMINE METHYLSULFATE 1 MG/ML IJ SOLN
INTRAMUSCULAR | Status: DC | PRN
  Administered 2012-10-05: 2.5 mg via INTRAVENOUS

## 2012-10-05 MED ORDER — LACTATED RINGERS IV SOLN
INTRAVENOUS | Status: DC
  Administered 2012-10-05 (×3): via INTRAVENOUS

## 2012-10-05 MED ORDER — HYDROMORPHONE HCL 2 MG PO TABS
2.0000 mg | ORAL_TABLET | ORAL | Status: DC | PRN
  Administered 2012-10-05 – 2012-10-06 (×3): 4 mg via ORAL

## 2012-10-05 MED ORDER — ROCURONIUM BROMIDE 100 MG/10ML IV SOLN
INTRAVENOUS | Status: DC | PRN
  Administered 2012-10-05: 30 mg via INTRAVENOUS

## 2012-10-05 MED ORDER — LIDOCAINE HCL 4 % MT SOLN
OROMUCOSAL | Status: DC | PRN
  Administered 2012-10-05: 4 mL via TOPICAL

## 2012-10-05 MED ORDER — CARVEDILOL 12.5 MG PO TABS: 12.5000 mg | ORAL_TABLET | Freq: Two times a day (BID) | ORAL | Status: DC

## 2012-10-05 MED ORDER — FENTANYL CITRATE 0.05 MG/ML IJ SOLN
INTRAMUSCULAR | Status: DC | PRN
  Administered 2012-10-05: 50 ug via INTRAVENOUS
  Administered 2012-10-05: 100 ug via INTRAVENOUS
  Administered 2012-10-05: 50 ug via INTRAVENOUS
  Administered 2012-10-05: 25 ug via INTRAVENOUS
  Administered 2012-10-05: 50 ug via INTRAVENOUS
  Administered 2012-10-05: 100 ug via INTRAVENOUS

## 2012-10-05 MED ORDER — METHOCARBAMOL 500 MG PO TABS
500.0000 mg | ORAL_TABLET | Freq: Four times a day (QID) | ORAL | Status: DC | PRN
  Administered 2012-10-05 – 2012-10-06 (×2): 500 mg via ORAL

## 2012-10-05 MED ORDER — MIDAZOLAM HCL 5 MG/5ML IJ SOLN
INTRAMUSCULAR | Status: DC | PRN
  Administered 2012-10-05: 1 mg via INTRAVENOUS

## 2012-10-05 MED ORDER — DEXTROSE-NACL 5-0.45 % IV SOLN
INTRAVENOUS | Status: DC
  Administered 2012-10-05: 75 mL/h via INTRAVENOUS

## 2012-10-05 MED ORDER — HYDROMORPHONE HCL PF 1 MG/ML IJ SOLN
0.2500 mg | INTRAMUSCULAR | Status: DC | PRN
  Administered 2012-10-05 (×4): 0.25 mg via INTRAVENOUS

## 2012-10-05 MED ORDER — LISINOPRIL 20 MG PO TABS: 20.0000 mg | ORAL_TABLET | Freq: Every day | ORAL | Status: DC

## 2012-10-05 MED ORDER — GLYCOPYRROLATE 0.2 MG/ML IJ SOLN
INTRAMUSCULAR | Status: DC | PRN
  Administered 2012-10-05: .5 mg via INTRAVENOUS
  Administered 2012-10-05: 0.2 mg via INTRAVENOUS

## 2012-10-05 MED ORDER — PROPOFOL 10 MG/ML IV BOLUS
INTRAVENOUS | Status: DC | PRN
  Administered 2012-10-05: 180 mg via INTRAVENOUS

## 2012-10-05 MED ORDER — CEFAZOLIN SODIUM 1-5 GM-% IV SOLN
1.0000 g | Freq: Three times a day (TID) | INTRAVENOUS | Status: DC
  Administered 2012-10-05 – 2012-10-06 (×2): 1 g via INTRAVENOUS

## 2012-10-05 MED ORDER — LIDOCAINE HCL (CARDIAC) 20 MG/ML IV SOLN
INTRAVENOUS | Status: DC | PRN
  Administered 2012-10-05: 40 mg via INTRAVENOUS

## 2012-10-05 SURGICAL SUPPLY — 53 items
BANDAGE ELASTIC 6 VELCRO ST LF (GAUZE/BANDAGES/DRESSINGS) ×2 IMPLANT
BLADE HEX COATED 2.75 (ELECTRODE) ×2 IMPLANT
BLADE SURG 10 STRL SS (BLADE) ×4 IMPLANT
BLADE SURG 15 STRL LF DISP TIS (BLADE) ×1 IMPLANT
BLADE SURG 15 STRL SS (BLADE) ×1
CANISTER SUCTION 1200CC (MISCELLANEOUS) ×2 IMPLANT
CHLORAPREP W/TINT 26ML (MISCELLANEOUS) ×2 IMPLANT
CLOTH BEACON ORANGE TIMEOUT ST (SAFETY) ×2 IMPLANT
COTTONBALL LRG STERILE PKG (GAUZE/BANDAGES/DRESSINGS) IMPLANT
COVER MAYO STAND STRL (DRAPES) ×2 IMPLANT
COVER TABLE BACK 60X90 (DRAPES) ×2 IMPLANT
DERMABOND ADVANCED (GAUZE/BANDAGES/DRESSINGS) ×2
DERMABOND ADVANCED .7 DNX12 (GAUZE/BANDAGES/DRESSINGS) ×2 IMPLANT
DRAIN CHANNEL 19F RND (DRAIN) IMPLANT
DRAPE LAPAROSCOPIC ABDOMINAL (DRAPES) ×2 IMPLANT
DRAPE UTILITY XL STRL (DRAPES) ×2 IMPLANT
DRSG PAD ABDOMINAL 8X10 ST (GAUZE/BANDAGES/DRESSINGS) ×6 IMPLANT
ELECT REM PT RETURN 9FT ADLT (ELECTROSURGICAL) ×2
ELECTRODE REM PT RTRN 9FT ADLT (ELECTROSURGICAL) ×1 IMPLANT
EVACUATOR SILICONE 100CC (DRAIN) IMPLANT
FILTER 7/8 IN (FILTER) ×2 IMPLANT
GAUZE SPONGE 4X4 12PLY STRL LF (GAUZE/BANDAGES/DRESSINGS) IMPLANT
GLOVE BIO SURGEON STRL SZ7.5 (GLOVE) ×2 IMPLANT
GLOVE BIO SURGEON STRL SZ8 (GLOVE) IMPLANT
GLOVE BIOGEL PI IND STRL 8 (GLOVE) ×1 IMPLANT
GLOVE BIOGEL PI INDICATOR 8 (GLOVE) ×1
GLOVE SKINSENSE NS SZ7.0 (GLOVE) ×1
GLOVE SKINSENSE STRL SZ7.0 (GLOVE) ×1 IMPLANT
GOWN PREVENTION PLUS XLARGE (GOWN DISPOSABLE) ×2 IMPLANT
GOWN PREVENTION PLUS XXLARGE (GOWN DISPOSABLE) ×2 IMPLANT
NS IRRIG 1000ML POUR BTL (IV SOLUTION) ×2 IMPLANT
PACK BASIN DAY SURGERY FS (CUSTOM PROCEDURE TRAY) ×2 IMPLANT
PAD EYE OVAL STERILE LF (GAUZE/BANDAGES/DRESSINGS) IMPLANT
PIN SAFETY STERILE (MISCELLANEOUS) IMPLANT
SLEEVE SCD COMPRESS KNEE MED (MISCELLANEOUS) ×2 IMPLANT
SPECIMEN JAR X LARGE (MISCELLANEOUS) ×4 IMPLANT
SPONGE GAUZE 4X4 12PLY (GAUZE/BANDAGES/DRESSINGS) ×4 IMPLANT
SPONGE LAP 18X18 X RAY DECT (DISPOSABLE) ×8 IMPLANT
SUT MNCRL AB 3-0 PS2 18 (SUTURE) ×20 IMPLANT
SUT MNCRL AB 4-0 PS2 18 (SUTURE) ×8 IMPLANT
SUT PROLENE 4 0 PS 2 18 (SUTURE) IMPLANT
SUT PROLENE 5 0 PS 2 (SUTURE) IMPLANT
SUT SILK 4 0 PS 2 (SUTURE) IMPLANT
SUT VIC AB 2-0 PS2 27 (SUTURE) IMPLANT
SUT VICRYL 3-0 CR8 SH (SUTURE) IMPLANT
SYR BULB IRRIGATION 50ML (SYRINGE) ×4 IMPLANT
TOWEL OR 17X24 6PK STRL BLUE (TOWEL DISPOSABLE) ×6 IMPLANT
TOWEL OR NON WOVEN STRL DISP B (DISPOSABLE) ×2 IMPLANT
TRAY FOLEY CATH 14FR (SET/KITS/TRAYS/PACK) IMPLANT
TUBE CONNECTING 20X1/4 (TUBING) ×2 IMPLANT
UNDERPAD 30X30 INCONTINENT (UNDERPADS AND DIAPERS) ×4 IMPLANT
VAC PENCILS W/TUBING CLEAR (MISCELLANEOUS) ×2 IMPLANT
YANKAUER SUCT BULB TIP NO VENT (SUCTIONS) ×2 IMPLANT

## 2012-10-05 NOTE — Anesthesia Procedure Notes (Signed)
Procedure Name: Intubation Date/Time: 10/05/2012 1:07 PM Performed by: Meyer Russel Pre-anesthesia Checklist: Patient identified, Emergency Drugs available, Suction available and Patient being monitored Patient Re-evaluated:Patient Re-evaluated prior to inductionOxygen Delivery Method: Circle System Utilized Preoxygenation: Pre-oxygenation with 100% oxygen Intubation Type: IV induction Ventilation: Mask ventilation without difficulty Laryngoscope Size: Miller and 2 Grade View: Grade I Tube type: Oral Tube size: 7.0 mm Number of attempts: 1 Airway Equipment and Method: stylet,  oral airway and LTA kit utilized Placement Confirmation: ETT inserted through vocal cords under direct vision,  positive ETCO2 and breath sounds checked- equal and bilateral Secured at: 22 cm Tube secured with: Tape Dental Injury: Teeth and Oropharynx as per pre-operative assessment

## 2012-10-05 NOTE — Anesthesia Preprocedure Evaluation (Addendum)
Anesthesia Evaluation  Patient identified by MRN, date of birth, ID band Patient awake  General Assessment Comment:Chart reviewed and patient examined.  Reviewed: Allergy & Precautions, H&P , NPO status , Patient's Chart, lab work & pertinent test results  Airway Mallampati: II      Dental   Pulmonary neg pulmonary ROS, shortness of breath and with exertion,  breath sounds clear to auscultation        Cardiovascular hypertension, Pt. on medications + dysrhythmias Supra Ventricular Tachycardia Rhythm:Regular Rate:Normal     Neuro/Psych    GI/Hepatic negative GI ROS, Neg liver ROS,   Endo/Other  negative endocrine ROS  Renal/GU negative Renal ROS     Musculoskeletal   Abdominal   Peds  Hematology   Anesthesia Other Findings   Reproductive/Obstetrics                          Anesthesia Physical Anesthesia Plan  ASA: III  Anesthesia Plan: General   Post-op Pain Management:    Induction: Intravenous  Airway Management Planned: Oral ETT  Additional Equipment:   Intra-op Plan:   Post-operative Plan: Extubation in OR  Informed Consent: I have reviewed the patients History and Physical, chart, labs and discussed the procedure including the risks, benefits and alternatives for the proposed anesthesia with the patient or authorized representative who has indicated his/her understanding and acceptance.   Dental advisory given  Plan Discussed with: CRNA and Anesthesiologist  Anesthesia Plan Comments:        Anesthesia Quick Evaluation

## 2012-10-05 NOTE — Transfer of Care (Signed)
Immediate Anesthesia Transfer of Care Note  Patient: Amy Adkins  Procedure(s) Performed: Procedure(s) (LRB) with comments: MAMMARY REDUCTION  (BREAST) (Bilateral)  Patient Location: PACU  Anesthesia Type:General  Level of Consciousness: sedated  Airway & Oxygen Therapy: Patient Spontanous Breathing and Patient connected to face mask oxygen  Post-op Assessment: Report given to PACU RN and Post -op Vital signs reviewed and stable  Post vital signs: Reviewed and stable  Complications: No apparent anesthesia complications

## 2012-10-05 NOTE — H&P (Signed)
I have reexamined and reevaluated the patient and there are no changes. See office notes for H&P (dated 09/27/2012 and 05/24/2012). Also faxes to medical records.

## 2012-10-05 NOTE — Brief Op Note (Signed)
10/05/2012  4:02 PM  PATIENT:  Amy Adkins  66 y.o. female  PRE-OPERATIVE DIAGNOSIS:  HYPERTROPHIC BILATERAL BREAST  POST-OPERATIVE DIAGNOSIS:  HYPERTROPHIC BILATERAL BREAST  PROCEDURE:  Procedure(s) (LRB) with comments: MAMMARY REDUCTION  (BREAST) (Bilateral)  SURGEON:  Surgeon(s) and Role:    * Etter Sjogren, MD - Primary  PHYSICIAN ASSISTANT:   ASSISTANTS: none   ANESTHESIA:   general  EBL:  Total I/O In: 2000 [I.V.:2000] Out: -   BLOOD ADMINISTERED:none  DRAINS: none   LOCAL MEDICATIONS USED:  NONE  SPECIMEN:  Source of Specimen:  bilateral breast  DISPOSITION OF SPECIMEN:  PATHOLOGY  COUNTS:  YES  TOURNIQUET:  * No tourniquets in log *  DICTATION: .Other Dictation: Dictation Number [id number]  PLAN OF CARE: Admit for overnight observation  PATIENT DISPOSITION:  PACU - hemodynamically stable.   Delay start of Pharmacological VTE agent (>24hrs) due to surgical blood loss or risk of bleeding: yes

## 2012-10-05 NOTE — Anesthesia Postprocedure Evaluation (Signed)
  Anesthesia Post-op Note  Patient: Amy Adkins  Procedure(s) Performed: Procedure(s) (LRB) with comments: MAMMARY REDUCTION  (BREAST) (Bilateral)  Patient Location: PACU  Anesthesia Type:General  Level of Consciousness: awake  Airway and Oxygen Therapy: Patient Spontanous Breathing  Post-op Pain: mild  Post-op Assessment: Post-op Vital signs reviewed, Patient's Cardiovascular Status Stable, Respiratory Function Stable, Patent Airway, No signs of Nausea or vomiting and Pain level controlled  Post-op Vital Signs: stable  Complications: No apparent anesthesia complications

## 2012-10-06 ENCOUNTER — Encounter (HOSPITAL_BASED_OUTPATIENT_CLINIC_OR_DEPARTMENT_OTHER): Payer: Self-pay | Admitting: Plastic Surgery

## 2012-10-06 NOTE — Discharge Summary (Signed)
Physician Discharge Summary  Patient ID: Amy Adkins MRN: [id number] DOB/AGE: 66-Aug-1947 66 y.o.  Admit date: 10/05/2012 Discharge date: 10/06/2012  Admission Diagnoses: Macromastia  Discharge Diagnoses: Same Active Problems:  * No active hospital problems. *    Discharged Condition: good  Hospital Course: On the day of admission the patient was taken to surgery and had bilateral breast reduction. The patient tolerated the procedures well. Postoperatively, the nipple complexes maintained excellent color and capillary refill. The patient was ambulatory and tolerating diet on the first postoperative day. She is ready for discharge.She declined the use of DVT prophylaxis.Signs and symptoms of DVT and pulmonary embolism discussed.  Treatments: antibiotics: Ancef, anticoagulation: none and surgery: bilateral breast reduction.  Discharge Exam: Blood pressure 141/73, pulse 52, temperature 97.5 F (36.4 C), temperature source Oral, resp. rate 16, height 5\' 6"  (1.676 m), weight 157 lb (71.215 kg), SpO2 100.00%.  Operative sites: No evidence of bleeding or infection. Chest soft and nipple complexes have good color bilateral.  Disposition:Discharge. Return to office next week.      Medication List     As of 10/06/2012  8:28 AM    STOP taking these medications         aspirin 81 MG tablet      multivitamin tablet      TAKE these medications         carvedilol 12.5 MG tablet   Commonly known as: COREG   TAKE 1 TABLET BY MOUTH 2 TIMES DAILY WITH A MEAL.      estrogens (conjugated) 0.45 MG tablet   Commonly known as: PREMARIN   Take 0.45 mg by mouth daily.      lisinopril 20 MG tablet   Commonly known as: PRINIVIL,ZESTRIL   Take 1 tablet (20 mg total) by mouth daily.         SignedOdis Luster, Jeanna Giuffre M 10/06/2012, 8:28 AM

## 2012-10-06 NOTE — Op Note (Signed)
NAMEDAMONICA, CHOPRA NO.:  [id number]  MEDICAL RECORD NO.:  [id number]  LOCATION:                                 FACILITY:  PHYSICIAN:  Etter Sjogren, M.D.     DATE OF BIRTH:  01/03/1946  DATE OF PROCEDURE:  10/05/2012 DATE OF DISCHARGE:                              OPERATIVE REPORT   PREOPERATIVE DIAGNOSES:  Macromastia with upper back pain, neck pain, and shoulder pain.  POSTOPERATIVE DIAGNOSES:  Macromastia with upper back pain, neck pain, and shoulder pain.  PROCEDURE PERFORMED:  Bilateral breast reduction.  SURGEON:  Etter Sjogren, M.D.  ANESTHESIA:  General.  ESTIMATED BLOOD LOSS:  175 mL.  CLINICAL NOTE:  A 66 year old woman had complaints of large breasts with upper back pain, neck pain, shoulder pain, and bra strap shoulder grooving.  She desires a breast reduction.  She is asymmetric.  She felt the left breast larger than right.  The nature of procedure and risks, possible complications were discussed with her at length and she was very clear on the amounts to be excised.  The plan was an excision of 400 g on the larger left side and 275 g on the right side.  The differential was determined by using the Sizer in the office and she did confirm a 125 g difference between the two sides.  The risks were discussed include, but not limited to, bleeding, infection, healing problems, scarring, loss of sensation, fluid accumulations, loss of sensation in the nipple, loss of tissue, loss of the nipple, loss of skin, loss of fatty tissue, pulmonary embolism, asymmetry, disappointment, failure to relieve symptoms, and altered body image. She understood all of this and wished to proceed.  DESCRIPTION OF PROCEDURE:  The patient was marked in the holding area for the breast reduction bilateral.  She was then taken to the operating room and placed supine.  After successful induction of general anesthesia, she was prepped with ChloraPrep and after waiting  full 3 minutes for drying, she was draped with sterile drapes.  The 38-mm marker marked the nipple-areolar complexes and an 8-cm wide inferior nipple-areolar pedicle designed on each side.  Incision was made and the pedicles were deepithelialized.  The pedicles were then isolated from the surrounding tissue beveling in an outward direction medial and lateral in order to ensure a broader attachment at the level of the chest wall, then was present at the level of the skin.  At conclusion, this dissection from nipple-areolar complexes were inspected and were found to have bright red bleeding around the periphery consistent with viability.  The resections were performed medial, central, and lateral. Total of 276 g from the smaller right side and 406 g from the larger left side.  This seemed to give a very nice symmetry.  Thorough irrigation with saline and meticulous hemostasis was achieved using electrocautery.  After confirming excellent hemostasis, the closure with 2-0 Vicryl interrupted inverted deep suture at the inverted T-position followed by 3-0 Monocryl interrupted inverted deep dermal sutures and running 3-0 Monocryl subcuticular suture.  A measurement was then taken 5 cm up from the inframammary crease and the 38-mm marker marked the site for the nipple-areolar  complex.  The tissue was resected. Irrigation was performed and meticulous hemostasis with the electrocautery.  The nipple-areolar complex was brought up through these openings.  They were again inspected and found to have excellent color and again, bright red bleeding around the periphery of both nipple- areolar complexes consistent with viability.  Nipple-areolar insetting with 3-0 Monocryl interrupted inverted deep dermal sutures and a running 4-0 Monocryl subcuticular suture.  Dermabond, dry sterile dressings, and a circumferential Ace wrap applied, and she was transported to the recovery room in stable having tolerated  the procedure well.     Etter Sjogren, M.D.     DB/MEDQ  D:  10/05/2012  T:  10/06/2012  Job:  [id number]

## 2012-10-23 ENCOUNTER — Other Ambulatory Visit: Payer: Self-pay | Admitting: *Deleted

## 2012-10-23 MED ORDER — LISINOPRIL 20 MG PO TABS: 20.0000 mg | ORAL_TABLET | Freq: Every day | ORAL | Status: DC

## 2013-04-24 ENCOUNTER — Other Ambulatory Visit: Payer: Self-pay | Admitting: Cardiology

## 2013-05-20 ENCOUNTER — Ambulatory Visit (INDEPENDENT_AMBULATORY_CARE_PROVIDER_SITE_OTHER): Payer: Self-pay | Admitting: Emergency Medicine

## 2013-05-20 VITALS — BP 130/76 | HR 60 | Temp 97.7°F | Resp 18 | Ht 65.5 in | Wt 159.6 lb

## 2013-05-20 DIAGNOSIS — R21 Rash and other nonspecific skin eruption: Secondary | ICD-10-CM

## 2013-05-20 MED ORDER — MUPIROCIN 2 % EX OINT: TOPICAL_OINTMENT | Freq: Three times a day (TID) | CUTANEOUS | Status: DC

## 2013-05-20 MED ORDER — DOXYCYCLINE HYCLATE 100 MG PO CAPS: 100.0000 mg | ORAL_CAPSULE | Freq: Two times a day (BID) | ORAL | Status: DC

## 2013-05-20 NOTE — Patient Instructions (Addendum)
You can take Zyrtec 10 mg one a day. You can take Benadryl 25 mg 1 to 2 at night as needed for itching

## 2013-05-20 NOTE — Progress Notes (Signed)
  Subjective:    Patient ID: Amy Adkins, female    DOB: 1946/10/23, 67 y.o.   MRN: [id number]  HPI Patient presents with raised red sores on both of her inner thighs that began 2 days ago. She states that they burn, itch and sting. She denies bleeding from the sores. They are not appearing on any other part of her body. She stated that she was not able to sleep well last night.   Review of Systems     Objective:   Physical Exam para 1-2 cm raised red irregular areas on the inside of both legs. They are tender to touch and to the lesions have a surrounding blanching to the area of redness. The patient does not feel she does not have any adenopathy in the groin.        Assessment & Plan:  I am not sure what these lesions represent. They could be high with a could be bites. 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

## 2013-05-23 LAB — WOUND CULTURE
Gram Stain: NONE SEEN
Gram Stain: NONE SEEN
Organism ID, Bacteria: NO GROWTH

## 2013-10-26 ENCOUNTER — Other Ambulatory Visit: Payer: Self-pay | Admitting: Cardiology

## 2013-10-31 ENCOUNTER — Ambulatory Visit: Payer: Medicare Other | Admitting: Cardiology

## 2013-11-05 ENCOUNTER — Encounter: Payer: Self-pay | Admitting: Cardiology

## 2013-11-28 ENCOUNTER — Encounter: Payer: Self-pay | Admitting: Cardiology

## 2013-11-28 ENCOUNTER — Ambulatory Visit (INDEPENDENT_AMBULATORY_CARE_PROVIDER_SITE_OTHER): Payer: 59 | Admitting: Cardiology

## 2013-11-28 ENCOUNTER — Encounter: Payer: Self-pay | Admitting: *Deleted

## 2013-11-28 VITALS — BP 126/72 | HR 59 | Ht 65.5 in | Wt 164.0 lb

## 2013-11-28 DIAGNOSIS — R0989 Other specified symptoms and signs involving the circulatory and respiratory systems: Secondary | ICD-10-CM

## 2013-11-28 DIAGNOSIS — R0600 Dyspnea, unspecified: Secondary | ICD-10-CM

## 2013-11-28 DIAGNOSIS — R42 Dizziness and giddiness: Secondary | ICD-10-CM

## 2013-11-28 DIAGNOSIS — I498 Other specified cardiac arrhythmias: Secondary | ICD-10-CM

## 2013-11-28 DIAGNOSIS — I428 Other cardiomyopathies: Secondary | ICD-10-CM

## 2013-11-28 DIAGNOSIS — R0609 Other forms of dyspnea: Secondary | ICD-10-CM

## 2013-11-28 DIAGNOSIS — I43 Cardiomyopathy in diseases classified elsewhere: Secondary | ICD-10-CM

## 2013-11-28 DIAGNOSIS — R001 Bradycardia, unspecified: Secondary | ICD-10-CM

## 2013-11-28 DIAGNOSIS — I119 Hypertensive heart disease without heart failure: Secondary | ICD-10-CM

## 2013-11-28 NOTE — Patient Instructions (Addendum)
Your physician has requested that you have an echocardiogram. Echocardiography is a painless test that uses sound waves to create images of your heart. It provides your doctor with information about the size and shape of your heart and how well your heart's chambers and valves are working. This procedure takes approximately one hour. There are no restrictions for this procedure.  Your physician has recommended that you wear a holter monitor. Holter monitors are medical devices that record the heart's electrical activity. Doctors most often use these monitors to diagnose arrhythmias. Arrhythmias are problems with the speed or rhythm of the heartbeat. The monitor is a small, portable device. You can wear one while you do your normal daily activities. This is usually used to diagnose what is causing palpitations/syncope (passing out). 48 HOUR  Your physician recommends that you schedule a follow-up appointment in: 1 month with Dr Shirlee Latch.

## 2013-11-29 DIAGNOSIS — R001 Bradycardia, unspecified: Secondary | ICD-10-CM | POA: Insufficient documentation

## 2013-11-29 NOTE — Progress Notes (Signed)
Patient ID: Amy Adkins, female   DOB: 10/31/1945, 68 y.o.   MRN: [id number] PCP: Dr. Maurice SmallElaine Griffin  68 yo with history of SVT s/p ablation and mild cardiomyopathy presents for cardiology followup.  She has been seen by Dr. Mayford Knifeurner and then Dr. Daleen SquibbWall in the past.  I reviewed her records.  Her cardiac history is a bit difficult to follow.  It appears that she had an abnormal stress test 2007.  She had LHC at that time showing normal coronaries.  She had an echo in 4/11 that showed EF 45-50% with akinesis of the basal anteroseptum.  A stress Myoview in 4/12 showed EF 35% with scarring in the septum but minimal evidence for ischemia.   Patient has several cardiac symptoms.  Chronically for years, she has been getting shortness of breath and chest pressure with exertion.  This will happen with moderate exertion such as carrying a load or walking up a flight of steps.  There has been no change to this pattern.  She also has been getting spells where she feels like her heart "slows down" for 30-45 seconds.  She will then feel lightheaded.  The spells pass relatively quickly.  They can occur when standing or sitting down.  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  No recent syncope.  She also gets occasional short runs of tachypalpitations.   ECG: NSR, occasional PVCs  Labs (1/15): K 4, creatinine 1.04, LDL 96, HDL 62  PMH:  1. HTN 2. SVT: s/p ablation in 2001 in [place] 3. Cardiomyopathy: Uncertain etiology.  LHC (2007) with normal coronaries.  Echo (4/11) with EF 45-50%, akinesis of the basal anteroseptum.  Stress Myoview (4/12) with scar in the septum but minimal evidence for ischemia, EF 35%.   SH: Divorced, TEFL teachersurgical tech, nonsmoker, lives in [place].  FH: Father with MI at 1252, mother with HTN, sister with myocarditis.    ROS:  All systems reviewed and negative except as per HPI.   Current  Outpatient Prescriptions  Medication Sig Dispense Refill  . carvedilol (COREG) 12.5 MG tablet TAKE 1 TABLET BY MOUTH 2 TIMES DAILY WITH A MEAL.  180 tablet  1  . estrogens, conjugated, (PREMARIN) 0.45 MG tablet Take 0.45 mg by mouth daily.       Marland Kitchen. lisinopril (PRINIVIL,ZESTRIL) 20 MG tablet TAKE 1 TABLET BY MOUTH DAILY.  90 tablet  0   No current facility-administered medications for this visit.    BP 126/72  Pulse 59  Ht 5' 5.5" (1.664 m)  Wt 74.39 kg (164 lb)  BMI 26.87 kg/m2 General: NAD Neck: No JVD, no thyromegaly or thyroid nodule.  Lungs: Clear to auscultation bilaterally with normal respiratory effort. CV: Nondisplaced PMI.  Heart regular S1/S2, no S3/S4, no murmur.  No peripheral edema.  No carotid bruit.  Normal pedal pulses.  Abdomen: Soft, nontender, no hepatosplenomegaly, no distention.  Skin: Intact without lesions or rashes.  Neurologic: Alert and oriented x 3.  Psych: Normal affect. Extremities: No clubbing or cyanosis.   Assessment/Plan: 1. Cardiomyopathy: Uncertain etiology.  ?Hypertensive cardiomyopathy. Last echo with EF 45-50% from 2011, Myoview from 2012 with EF 35% and minimal ischemia.  LHC in 2007 with no significant CAD.  She has stable NYHA class II-III symptoms (dyspnea and chest pressure with moderate exertion).  She is not volume overloaded on exam.  I am going  to repeat an echocardiogram to start with and see what her LV function looks like.  She will continue current doses of Coreg and lisinopril.   2. Bradycardia/lightheadedness: Patient feels like she has spells of bradycardia where she will get lightheaded.  Often she is on her feet when she has these spells.  These could be vagal symptoms.  They have been going on for years but are worse recently, about 2 episodes/day.  I am going to have her wear a 48 hour holter monitor to assess.  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   3. HTN: BP is well-controlled on current regimen.    Followup in 1 month.   Marca Ancona 11/29/2013

## 2013-12-03 ENCOUNTER — Other Ambulatory Visit: Payer: Self-pay | Admitting: Family Medicine

## 2013-12-03 DIAGNOSIS — Z9889 Other specified postprocedural states: Secondary | ICD-10-CM

## 2013-12-03 DIAGNOSIS — N644 Mastodynia: Secondary | ICD-10-CM

## 2013-12-03 DIAGNOSIS — N63 Unspecified lump in unspecified breast: Secondary | ICD-10-CM

## 2013-12-06 ENCOUNTER — Other Ambulatory Visit: Payer: Self-pay | Admitting: Obstetrics & Gynecology

## 2013-12-06 MED ORDER — ALPRAZOLAM 0.5 MG PO TABS: ORAL_TABLET | ORAL | Status: DC

## 2013-12-06 MED ORDER — OXYCODONE-ACETAMINOPHEN 5-325 MG PO TABS: ORAL_TABLET | ORAL | Status: DC

## 2013-12-06 NOTE — Progress Notes (Signed)
Manervia has extensive painful breast scar tissue due to prior reduction surgery.  Pt premedication prior to mammogram.  Percoet and xanax prescribed for pre-mammogram.  Pt understands she cannot drive while taking these medications.

## 2013-12-07 ENCOUNTER — Encounter (INDEPENDENT_AMBULATORY_CARE_PROVIDER_SITE_OTHER): Payer: 59

## 2013-12-07 ENCOUNTER — Encounter: Payer: Self-pay | Admitting: Cardiology

## 2013-12-07 ENCOUNTER — Encounter: Payer: Self-pay | Admitting: Radiology

## 2013-12-07 ENCOUNTER — Ambulatory Visit (HOSPITAL_COMMUNITY): Payer: 59 | Attending: Cardiology | Admitting: Radiology

## 2013-12-07 DIAGNOSIS — I428 Other cardiomyopathies: Secondary | ICD-10-CM | POA: Insufficient documentation

## 2013-12-07 DIAGNOSIS — I079 Rheumatic tricuspid valve disease, unspecified: Secondary | ICD-10-CM | POA: Insufficient documentation

## 2013-12-07 DIAGNOSIS — R42 Dizziness and giddiness: Secondary | ICD-10-CM

## 2013-12-07 DIAGNOSIS — R0602 Shortness of breath: Secondary | ICD-10-CM | POA: Insufficient documentation

## 2013-12-07 DIAGNOSIS — I10 Essential (primary) hypertension: Secondary | ICD-10-CM | POA: Insufficient documentation

## 2013-12-07 DIAGNOSIS — R079 Chest pain, unspecified: Secondary | ICD-10-CM | POA: Insufficient documentation

## 2013-12-07 DIAGNOSIS — I498 Other specified cardiac arrhythmias: Secondary | ICD-10-CM | POA: Insufficient documentation

## 2013-12-07 DIAGNOSIS — I059 Rheumatic mitral valve disease, unspecified: Secondary | ICD-10-CM | POA: Insufficient documentation

## 2013-12-07 NOTE — Progress Notes (Signed)
Echocardiogram performed.  

## 2013-12-07 NOTE — Progress Notes (Unsigned)
Patient ID: Amy Adkins, female   DOB: 1946-02-07, 68 y.o.   MRN: [id number] Evo 48hr holter applied

## 2013-12-10 ENCOUNTER — Ambulatory Visit
Admission: RE | Admit: 2013-12-10 | Discharge: 2013-12-10 | Disposition: A | Discharge status: Home / Self Care | Payer: Medicare Other | Source: Ambulatory Visit | Attending: Family Medicine | Admitting: Family Medicine

## 2013-12-10 ENCOUNTER — Other Ambulatory Visit: Payer: Self-pay | Admitting: Family Medicine

## 2013-12-10 DIAGNOSIS — N63 Unspecified lump in unspecified breast: Secondary | ICD-10-CM

## 2013-12-10 DIAGNOSIS — N644 Mastodynia: Secondary | ICD-10-CM

## 2013-12-10 DIAGNOSIS — Z9889 Other specified postprocedural states: Secondary | ICD-10-CM

## 2013-12-11 ENCOUNTER — Other Ambulatory Visit: Payer: 59

## 2013-12-14 ENCOUNTER — Telehealth: Payer: Self-pay | Admitting: *Deleted

## 2013-12-14 MED ORDER — CARVEDILOL 6.25 MG PO TABS: 18.7500 mg | ORAL_TABLET | Freq: Two times a day (BID) | ORAL | Status: DC

## 2013-12-14 NOTE — Telephone Encounter (Signed)
lmtcb re monitor results Mylo Red RN

## 2013-12-14 NOTE — Telephone Encounter (Signed)
Pt is aware of monitor results. Agrees to increasing carvedilol to 18.75mg  bid Would  Like the 6.25mg  tablets I have ordered this & sent in for pt  Mylo Red RN

## 2013-12-26 ENCOUNTER — Ambulatory Visit (INDEPENDENT_AMBULATORY_CARE_PROVIDER_SITE_OTHER): Payer: 59 | Admitting: Cardiology

## 2013-12-26 ENCOUNTER — Encounter: Payer: Self-pay | Admitting: Cardiology

## 2013-12-26 VITALS — BP 135/78 | HR 88 | Ht 65.5 in | Wt 161.0 lb

## 2013-12-26 DIAGNOSIS — I429 Cardiomyopathy, unspecified: Secondary | ICD-10-CM

## 2013-12-26 DIAGNOSIS — I428 Other cardiomyopathies: Secondary | ICD-10-CM

## 2013-12-26 DIAGNOSIS — I4729 Other ventricular tachycardia: Secondary | ICD-10-CM

## 2013-12-26 DIAGNOSIS — I119 Hypertensive heart disease without heart failure: Secondary | ICD-10-CM

## 2013-12-26 DIAGNOSIS — I472 Ventricular tachycardia: Secondary | ICD-10-CM

## 2013-12-26 NOTE — Patient Instructions (Signed)
Your physician has requested that you have a cardiac MRI. Cardiac MRI uses a computer to create images of your heart as its beating, producing both still and moving pictures of your heart and major blood vessels. For further information please visit [email or web address]. Please follow the instruction sheet given to you today for more information. [employer]  Your physician recommends that you return for lab work a week or so before the Cardiac MRI is done--BMET.

## 2013-12-28 DIAGNOSIS — I4729 Other ventricular tachycardia: Secondary | ICD-10-CM | POA: Insufficient documentation

## 2013-12-28 DIAGNOSIS — I429 Cardiomyopathy, unspecified: Secondary | ICD-10-CM | POA: Insufficient documentation

## 2013-12-28 DIAGNOSIS — I472 Ventricular tachycardia: Secondary | ICD-10-CM | POA: Insufficient documentation

## 2013-12-28 NOTE — Progress Notes (Signed)
Patient ID: Amy Adkins, female   DOB: 1946/08/06, 68 y.o.   MRN: [id number] PCP: Dr. Maurice SmallElaine Griffin  68 yo with history of SVT s/p ablation of some form in [place] and mild cardiomyopathy presents for cardiology followup.  Her cardiac history is a bit difficult to follow.  It appears that she had an abnormal stress test 2007.  She had LHC at that time showing normal coronaries.  She had an echo in 4/11 that showed EF 45-50% with akinesis of the basal anteroseptum.  A stress Myoview in 4/12 showed EF 35% with scarring in the septum but minimal evidence for ischemia.   Patient has several cardiac symptoms.  Chronically for years, she has been getting shortness of breath and chest pressure with exertion.  This will happen with moderate exertion such as carrying a load or walking up a flight of steps.  There has been no change to this pattern.  She also has been getting spells where she feels like her heart "slows down" for 30-45 seconds.  She will then feel lightheaded.  The spells pass relatively quickly.  They can occur when standing or sitting down.  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  No recent syncope.  She also gets occasional short runs of tachypalpitations.   I had her wear a 24 hr holter in 2/15.  This showed PACs, PVCs, and a 5 beat run NSVT.  I had her increase her Coreg to 18.75 mg bid.  She has not noted much difference in her palpitations since doing this.  I also had her get an echocardiogram.  This showed EF 45% with akinesis and thinning of the basal to mid anteroseptum and inferoseptum with normal RV and valves.    Labs (1/15): K 4, creatinine 1.04, LDL 96, HDL 62  PMH:  1. HTN 2. SVT: s/p ablation in 2001 in [place].  Holter (2/15) with PACs, PVCs, 5 beat run NSVT.  3. Cardiomyopathy: Uncertain etiology.  LHC (2007) with normal coronaries.  Echo (4/11) with EF 45-50%,  akinesis of the basal anteroseptum.  Stress Myoview (4/12) with scar in the septum but minimal evidence for ischemia, EF 35%.  Echo (2/15) with EF 45%, thin/akinetic basal to mid anteroseptum and inferoseptum, basal inferior akinesis, normal RV size/systolic function, normal valvular function.   SH: Divorced, TEFL teachersurgical tech, nonsmoker, lives in [place].  FH: Father with MI at 5752, mother with HTN, sister with myocarditis.    ROS:  All systems reviewed and negative except as per HPI.   Current Outpatient Prescriptions  Medication Sig Dispense Refill  . ALPRAZolam (XANAX) 0.5 MG tablet Take 1 tablet prior to procedure, repeat upon arrival to facility if needed.  2 tablet  0  . carvedilol (COREG) 6.25 MG tablet Take 3 tablets (18.75 mg total) by mouth 2 (two) times daily with a meal.  540 tablet  2  . lisinopril (PRINIVIL,ZESTRIL) 20 MG tablet TAKE 1 TABLET BY MOUTH DAILY.  90 tablet  0  . oxyCODONE-acetaminophen (ROXICET) 5-325 MG per tablet Take 1-2 tablets prior to procedure.  Take 1 tablet q 4 hours post procedure for pain.  5 tablet  0   No current facility-administered medications for this visit.    BP 135/78  Pulse 88  Ht 5' 5.5" (1.664 m)  Wt 73.029 kg (161 lb)  BMI 26.37 kg/m2 General: NAD Neck:  No JVD, no thyromegaly or thyroid nodule.  Lungs: Clear to auscultation bilaterally with normal respiratory effort. CV: Nondisplaced PMI.  Heart regular S1/S2, no S3/S4, no murmur.  No peripheral edema.  No carotid bruit.  Normal pedal pulses.  Abdomen: Soft, nontender, no hepatosplenomegaly, no distention.  Skin: Intact without lesions or rashes.  Neurologic: Alert and oriented x 3.  Psych: Normal affect. Extremities: No clubbing or cyanosis.   Assessment/Plan: 1. Cardiomyopathy: Uncertain etiology, has been recognized for a number of years now.  LHC in 2007 with no significant CAD.  Most recent echo in 2/15 shows a rather unusual pattern of wall motion abnormalities.  She has  akinesis and thinning of the basal to mid anteroseptum and inferoseptum, as well as basal inferior akinesis, overall EF 45%.  I am going to try to get the records of the ablation that she had in [place].  It is not inconceivable that an ETOH septal ablation for HCM would cause cause the noted basal septal abnormalities.  I am going to look closer at her LV with a cardiac MRI.  She has stable NYHA class II-III symptoms (dyspnea and chest pressure with moderate exertion).  She is not volume overloaded on exam. She will continue current doses of Coreg and lisinopril.   2. Palpitations: Holter showed PACs, PVCs, and NSVT.  The area of apparent scarring seen on the echo could certainly be the nidus for ventricular arrhythmias.  As above, I have increased Coreg and will get a cardiac MRI to look closer at the LV.  3. HTN: BP is well-controlled on current regimen.   Followup in 3 months.  Marca Ancona 12/28/2013

## 2013-12-31 ENCOUNTER — Telehealth: Payer: Self-pay | Admitting: Cardiology

## 2013-12-31 NOTE — Telephone Encounter (Signed)
ROI faxed to [facility] @ [phone]

## 2014-01-01 ENCOUNTER — Encounter (HOSPITAL_COMMUNITY): Admission: RE | Payer: Self-pay | Source: Ambulatory Visit

## 2014-01-01 ENCOUNTER — Ambulatory Visit (HOSPITAL_COMMUNITY): Admission: RE | Admit: 2014-01-01 | Payer: 59 | Source: Ambulatory Visit | Admitting: Ophthalmology

## 2014-01-01 SURGERY — PHACOEMULSIFICATION, CATARACT, WITH IOL INSERTION
Anesthesia: Monitor Anesthesia Care | Laterality: Right

## 2014-01-08 ENCOUNTER — Encounter: Payer: Self-pay | Admitting: Cardiology

## 2014-01-09 ENCOUNTER — Other Ambulatory Visit (HOSPITAL_COMMUNITY): Payer: 59

## 2014-01-15 ENCOUNTER — Ambulatory Visit (HOSPITAL_COMMUNITY): Admission: RE | Admit: 2014-01-15 | Payer: 59 | Source: Ambulatory Visit | Admitting: Ophthalmology

## 2014-01-15 ENCOUNTER — Encounter (HOSPITAL_COMMUNITY): Admission: RE | Payer: Self-pay | Source: Ambulatory Visit

## 2014-01-15 SURGERY — PHACOEMULSIFICATION, CATARACT, WITH IOL INSERTION
Anesthesia: Monitor Anesthesia Care | Laterality: Left

## 2014-01-28 ENCOUNTER — Other Ambulatory Visit: Payer: Self-pay | Admitting: *Deleted

## 2014-01-28 ENCOUNTER — Ambulatory Visit (HOSPITAL_COMMUNITY)
Admission: RE | Admit: 2014-01-28 | Discharge: 2014-01-28 | Disposition: A | Discharge status: Home / Self Care | Payer: 59 | Source: Ambulatory Visit | Attending: Cardiology | Admitting: Cardiology

## 2014-01-28 DIAGNOSIS — I428 Other cardiomyopathies: Secondary | ICD-10-CM

## 2014-01-28 DIAGNOSIS — I119 Hypertensive heart disease without heart failure: Secondary | ICD-10-CM

## 2014-01-28 LAB — CREATININE, SERUM
CREATININE: 1.15 mg/dL — AB (ref 0.50–1.10)
GFR calc Af Amer: 55 mL/min — ABNORMAL LOW (ref >90)
GFR calc non Af Amer: 48 mL/min — ABNORMAL LOW (ref >90)

## 2014-01-28 MED ORDER — GADOBENATE DIMEGLUMINE 529 MG/ML IV SOLN
25.0000 mL | Freq: Once | INTRAVENOUS | Status: AC
Start: 2014-01-28 — End: 2014-01-28
  Administered 2014-01-28: 25 mL via INTRAVENOUS

## 2014-01-28 MED ORDER — CARVEDILOL 6.25 MG PO TABS: 18.7500 mg | ORAL_TABLET | Freq: Two times a day (BID) | ORAL | Status: DC

## 2014-01-28 MED ORDER — LISINOPRIL 20 MG PO TABS: 20.0000 mg | ORAL_TABLET | Freq: Every day | ORAL | Status: DC

## 2014-02-04 ENCOUNTER — Other Ambulatory Visit: Payer: Self-pay | Admitting: *Deleted

## 2014-02-04 DIAGNOSIS — I472 Ventricular tachycardia: Secondary | ICD-10-CM

## 2014-02-04 DIAGNOSIS — I428 Other cardiomyopathies: Secondary | ICD-10-CM

## 2014-02-04 DIAGNOSIS — I4729 Other ventricular tachycardia: Secondary | ICD-10-CM

## 2014-02-06 ENCOUNTER — Ambulatory Visit (INDEPENDENT_AMBULATORY_CARE_PROVIDER_SITE_OTHER)
Admission: RE | Admit: 2014-02-06 | Discharge: 2014-02-06 | Disposition: A | Discharge status: Home / Self Care | Payer: 59 | Source: Ambulatory Visit | Attending: Cardiology | Admitting: Cardiology

## 2014-02-06 DIAGNOSIS — I472 Ventricular tachycardia: Secondary | ICD-10-CM

## 2014-02-06 DIAGNOSIS — I428 Other cardiomyopathies: Secondary | ICD-10-CM

## 2014-02-06 DIAGNOSIS — I4729 Other ventricular tachycardia: Secondary | ICD-10-CM

## 2014-02-15 ENCOUNTER — Encounter (INDEPENDENT_AMBULATORY_CARE_PROVIDER_SITE_OTHER): Payer: Self-pay

## 2014-02-15 ENCOUNTER — Encounter: Payer: Self-pay | Admitting: Cardiology

## 2014-02-15 ENCOUNTER — Ambulatory Visit (INDEPENDENT_AMBULATORY_CARE_PROVIDER_SITE_OTHER): Payer: 59 | Admitting: Cardiology

## 2014-02-15 VITALS — BP 110/80 | HR 80 | Ht 65.0 in | Wt 163.8 lb

## 2014-02-15 DIAGNOSIS — I428 Other cardiomyopathies: Secondary | ICD-10-CM

## 2014-02-15 DIAGNOSIS — I429 Cardiomyopathy, unspecified: Secondary | ICD-10-CM

## 2014-02-15 DIAGNOSIS — R002 Palpitations: Secondary | ICD-10-CM

## 2014-02-15 DIAGNOSIS — I472 Ventricular tachycardia: Secondary | ICD-10-CM

## 2014-02-15 DIAGNOSIS — I4729 Other ventricular tachycardia: Secondary | ICD-10-CM

## 2014-02-15 DIAGNOSIS — R55 Syncope and collapse: Secondary | ICD-10-CM

## 2014-02-15 DIAGNOSIS — D869 Sarcoidosis, unspecified: Secondary | ICD-10-CM

## 2014-02-15 NOTE — Patient Instructions (Addendum)
Your physician recommends that you schedule at Chest CT here in our office. Call Rose to schedule.   You have been referred to EP for consult for presyncopal episodes. Possible loop recorder.  Your physician recommends that you schedule a follow-up appointment in: 3 months with Dr. Shirlee Latch.

## 2014-02-17 NOTE — Progress Notes (Signed)
Patient ID: Amy Adkins, female   DOB: [birth year], 68 y.o.   MRN: [id number] PCP: Dr. 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  Her cardiac history is a bit difficult to follow.  It appears that she had an abnormal stress test 2007.  She had LHC at that time showing normal coronaries.  She had an echo in 4/11 that showed EF 45-50% with akinesis of the basal anteroseptum.  A stress Myoview in 4/12 showed EF 35% with scarring in the septum but minimal evidence for ischemia.   Patient has several cardiac symptoms.  Chronically for years, she has been getting shortness of breath and chest pressure with exertion.  This will happen with moderate exertion such as carrying a load or walking up a flight of steps.  There has been no change to this pattern.  She also has been getting frequent spells where she feels like her heart "slows down" for 30-45 seconds.  She will then feel lightheaded.  The spells pass relatively quickly.  They can occur when standing or sitting down.  She also has frequent episodes where she feels like her heart is racing.  These also make her lightheaded. 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  No recent syncope.   I had her wear a 24 hr holter in 2/15.  This showed PACs, PVCs, and a 5 beat run NSVT.  I had her increase her Coreg to 18.75 mg bid.  She has not noted much difference in her palpitations since doing this.  I also had her get an echocardiogram.  This showed EF 45% with akinesis and thinning of the basal to mid anteroseptum and inferoseptum with normal RV and valves.  Given the unusual appearance of the echo, I had her do a cardiac MRI in 3/15.  This showed EF 55% with basal to mid inferoseptal and basal to mid anteroseptal akinesis/dyskinesis.  There was mid-wall delayed enhancement in the basal anteroseptum and basal inferoseptum.  She  has no history of sarcoidosis.  She has the above noted chronic dyspnea.    Labs (1/15): K 4, creatinine 1.04, LDL 96, HDL 62  PMH:  1. 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  Holter (2/15) with PACs, PVCs, 5 beat run NSVT.  3. Cardiomyopathy: Uncertain etiology.  LHC (2007) with normal coronaries.  Echo (4/11) with EF 45-50%, akinesis of the basal anteroseptum.  Stress Myoview (4/12) with scar in the septum but minimal evidence for ischemia, EF 35%.  Echo (2/15) with EF 45%, thin/akinetic basal to mid anteroseptum and inferoseptum, basal inferior akinesis, normal RV size/systolic function, normal valvular function.  Cardiac MRI (3/15) with EF 55% with basal to mid inferoseptal and basal to mid anteroseptal akinesis/dyskinesis; there was mid-wall delayed enhancement in the basal anteroseptum and basal inferoseptum.   SH: Divorced, TEFL teachersurgical tech, nonsmoker, lives in [place].  FH: Father with MI at 1152, mother with HTN, sister with myocarditis.    ROS:  All systems reviewed and negative except as per HPI.   Current Outpatient Prescriptions  Medication Sig Dispense Refill  . carvedilol (COREG) 6.25 MG tablet Take 3 tablets (18.75 mg total) by mouth 2 (two) times daily with a meal.  540 tablet  2  . gabapentin (NEURONTIN) 100 MG capsule Take 100 mg by mouth at bedtime.       .Marland Kitchen  lisinopril (PRINIVIL,ZESTRIL) 20 MG tablet Take 1 tablet (20 mg total) by mouth daily.  90 tablet  2   No current facility-administered medications for this visit.    BP 110/80  Pulse 80  Ht 5\' 5"  (1.651 m)  Wt 74.299 kg (163 lb 12.8 oz)  BMI 27.26 kg/m2 General: NAD Neck: No JVD, no thyromegaly or thyroid nodule.  Lungs: Clear to auscultation bilaterally with normal respiratory effort. CV: Nondisplaced PMI.  Heart regular S1/S2, no S3/S4, no murmur.  No peripheral edema.  No carotid bruit.  Normal pedal pulses.  Abdomen: Soft, nontender, no hepatosplenomegaly, no distention.  Skin: Intact without  lesions or rashes.  Neurologic: Alert and oriented x 3.  Psych: Normal affect. Extremities: No clubbing or cyanosis.   Assessment/Plan: 1. Cardiomyopathy: Uncertain etiology, has been recognized for a number of years now.  LHC in 2007 with no significant CAD.  Most recent echo in 2/15 shows a rather unusual pattern of wall motion abnormalities.  She has akinesis and thinning of the basal to mid anteroseptum and inferoseptum, as well as basal inferior akinesis, overall EF 45%.  Given the unusual wall motion pattern and NSVT run noted on holter, cardiac MRI was done.  This showed EF 55% with basal to mid inferoseptal and basal to mid anteroseptal akinesis/dyskinesis.  There was mid-wall delayed enhancement in the basal anteroseptum and basal inferoseptum. This pattern could possibly represent prior myocarditis.  However, it would also be possible for this to be cardiac sarcoidosis.  The pattern is not consistent with CAD.  She has no history of sarcoidosis.  She has stable NYHA class II-III symptoms (dyspnea and chest pressure with moderate exertion).  She is not volume overloaded on exam.  - She will continue current doses of Coreg and lisinopril.   - I would like her to get a chest CT to assess for signs of pulmonary sarcoidosis.  2. Palpitations: Holter (24 hrs) showed PACs, PVCs, and NSVT.  The area of scarring seen on the echo and cardiac MRI could certainly be the nidus for ventricular arrhythmias.  She is on Coreg.  As above, concern for prior myocarditis versus sarcoidosis.  She continues to have frequent episodes where she will get lightheaded and feel her heart either slow down or race.  I suggested that she wear a 30 day monitor to further assess for arrhythmias, especially ventricular arrhythmias.  However, she had a hard time with the holter and does not want to wear a monitor again.  I am going to refer her to EP.  As she had an episode of syncope last year, it may be possible to have a loop  recorder placed.  I will also continue her on Coreg. 3. HTN: BP is well-controlled on current regimen.   Followup in 3 months.  Laurey Morale 02/17/2014

## 2014-02-19 ENCOUNTER — Ambulatory Visit (INDEPENDENT_AMBULATORY_CARE_PROVIDER_SITE_OTHER)
Admission: RE | Admit: 2014-02-19 | Discharge: 2014-02-19 | Disposition: A | Discharge status: Home / Self Care | Payer: 59 | Source: Ambulatory Visit | Attending: Cardiology | Admitting: Cardiology

## 2014-02-19 DIAGNOSIS — D869 Sarcoidosis, unspecified: Secondary | ICD-10-CM

## 2014-02-19 DIAGNOSIS — R55 Syncope and collapse: Secondary | ICD-10-CM

## 2014-02-19 MED ORDER — IOHEXOL 300 MG/ML  SOLN
80.0000 mL | Freq: Once | INTRAMUSCULAR | Status: AC | PRN
  Administered 2014-02-19: 80 mL via INTRAVENOUS

## 2014-02-27 ENCOUNTER — Other Ambulatory Visit (HOSPITAL_COMMUNITY): Payer: Medicare Other

## 2014-03-26 ENCOUNTER — Institutional Professional Consult (permissible substitution): Payer: Medicare Other | Admitting: Internal Medicine

## 2014-03-28 ENCOUNTER — Observation Stay (HOSPITAL_COMMUNITY)
Admission: EM | Admit: 2014-03-28 | Discharge: 2014-03-30 | Disposition: A | Discharge status: Home / Self Care | Payer: Medicare Other | Attending: Cardiology | Admitting: Cardiology

## 2014-03-28 ENCOUNTER — Encounter (HOSPITAL_COMMUNITY): Payer: Self-pay | Admitting: Emergency Medicine

## 2014-03-28 ENCOUNTER — Emergency Department (HOSPITAL_COMMUNITY): Payer: Medicare Other

## 2014-03-28 ENCOUNTER — Telehealth: Payer: Self-pay

## 2014-03-28 DIAGNOSIS — I429 Cardiomyopathy, unspecified: Secondary | ICD-10-CM | POA: Diagnosis present

## 2014-03-28 DIAGNOSIS — R0602 Shortness of breath: Secondary | ICD-10-CM | POA: Insufficient documentation

## 2014-03-28 DIAGNOSIS — R079 Chest pain, unspecified: Secondary | ICD-10-CM | POA: Diagnosis not present

## 2014-03-28 DIAGNOSIS — Z9889 Other specified postprocedural states: Secondary | ICD-10-CM | POA: Insufficient documentation

## 2014-03-28 DIAGNOSIS — Z79899 Other long term (current) drug therapy: Secondary | ICD-10-CM | POA: Diagnosis not present

## 2014-03-28 DIAGNOSIS — I10 Essential (primary) hypertension: Secondary | ICD-10-CM | POA: Insufficient documentation

## 2014-03-28 DIAGNOSIS — I4891 Unspecified atrial fibrillation: Secondary | ICD-10-CM | POA: Diagnosis present

## 2014-03-28 DIAGNOSIS — R110 Nausea: Secondary | ICD-10-CM | POA: Diagnosis not present

## 2014-03-28 DIAGNOSIS — I428 Other cardiomyopathies: Secondary | ICD-10-CM

## 2014-03-28 DIAGNOSIS — R0789 Other chest pain: Secondary | ICD-10-CM

## 2014-03-28 HISTORY — DX: Unspecified atrial fibrillation: I48.91

## 2014-03-28 HISTORY — DX: Ventricular tachycardia: I47.2

## 2014-03-28 HISTORY — DX: Other ventricular tachycardia: I47.29

## 2014-03-28 LAB — BASIC METABOLIC PANEL
BUN: 18 mg/dL (ref 6–23)
CO2: 24 meq/L (ref 19–32)
Calcium: 9.8 mg/dL (ref 8.4–10.5)
Chloride: 106 mEq/L (ref 96–112)
Creatinine, Ser: 1.03 mg/dL (ref 0.50–1.10)
GFR calc non Af Amer: 55 mL/min — ABNORMAL LOW (ref >90)
GFR, EST AFRICAN AMERICAN: 63 mL/min — AB (ref >90)
Glucose, Bld: 101 mg/dL — ABNORMAL HIGH (ref 70–99)
POTASSIUM: 4.2 meq/L (ref 3.7–5.3)
Sodium: 145 mEq/L (ref 137–147)

## 2014-03-28 LAB — CBC
HCT: 39 % (ref 36.0–46.0)
Hemoglobin: 13.2 g/dL (ref 12.0–15.0)
MCH: 26.1 pg (ref 26.0–34.0)
MCHC: 33.8 g/dL (ref 30.0–36.0)
MCV: 77.2 fL — AB (ref 78.0–100.0)
PLATELETS: 196 10*3/uL (ref 150–400)
RBC: 5.05 MIL/uL (ref 3.87–5.11)
RDW: 15.2 % (ref 11.5–15.5)
WBC: 8.3 10*3/uL (ref 4.0–10.5)

## 2014-03-28 LAB — I-STAT TROPONIN, ED: TROPONIN I, POC: 0 ng/mL (ref 0.00–0.08)

## 2014-03-28 MED ORDER — HEPARIN BOLUS VIA INFUSION
3000.0000 [IU] | Freq: Once | INTRAVENOUS | Status: AC
  Administered 2014-03-28: 3000 [IU] via INTRAVENOUS
  Filled 2014-03-28: qty 3000

## 2014-03-28 MED ORDER — ASPIRIN EC 81 MG PO TBEC
81.0000 mg | DELAYED_RELEASE_TABLET | Freq: Every day | ORAL | Status: DC
  Filled 2014-03-28: qty 1

## 2014-03-28 MED ORDER — MORPHINE SULFATE 4 MG/ML IJ SOLN
4.0000 mg | Freq: Once | INTRAMUSCULAR | Status: AC
  Administered 2014-03-28: 4 mg via INTRAVENOUS
  Filled 2014-03-28: qty 1

## 2014-03-28 MED ORDER — GABAPENTIN 100 MG PO CAPS
100.0000 mg | ORAL_CAPSULE | Freq: Every day | ORAL | Status: DC
  Filled 2014-03-28 (×3): qty 1

## 2014-03-28 MED ORDER — CARVEDILOL 6.25 MG PO TABS
18.7500 mg | ORAL_TABLET | Freq: Two times a day (BID) | ORAL | Status: DC
  Administered 2014-03-29 – 2014-03-30 (×3): 18.75 mg via ORAL
  Filled 2014-03-28 (×7): qty 1

## 2014-03-28 MED ORDER — LISINOPRIL 20 MG PO TABS
20.0000 mg | ORAL_TABLET | Freq: Every day | ORAL | Status: DC
  Administered 2014-03-29: 20 mg via ORAL
  Filled 2014-03-28 (×2): qty 1

## 2014-03-28 MED ORDER — HEPARIN (PORCINE) IN NACL 100-0.45 UNIT/ML-% IJ SOLN
1050.0000 [IU]/h | INTRAMUSCULAR | Status: DC
  Administered 2014-03-28: 1050 [IU]/h via INTRAVENOUS
  Filled 2014-03-28: qty 250

## 2014-03-28 MED ORDER — NITROGLYCERIN 0.4 MG SL SUBL
0.4000 mg | SUBLINGUAL_TABLET | SUBLINGUAL | Status: DC | PRN
  Administered 2014-03-28: 0.4 mg via SUBLINGUAL

## 2014-03-28 MED ORDER — NITROGLYCERIN 0.4 MG SL SUBL: 0.4000 mg | SUBLINGUAL_TABLET | SUBLINGUAL | Status: DC | PRN

## 2014-03-28 NOTE — H&P (Signed)
History and Physical  Patient ID: Amy Adkins MRN: [id number], SOB: June 10, 1946 68 y.o. Date of Encounter: 03/28/2014, 8:36 PM  Primary Physician: Astrid Divine, MD Primary Cardiologist: Dr. Shirlee Latch  Chief Complaint: chest pain, palpitations  HPI: 68 y.o. 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 on 03/28/2014 with complaints of chest pain. Initially sought care at urgent care but due to complaints of severe chest pain, 911 was called immediately. Upon EMS arrival, she was complaining of 10/10 chest pain with radiation to her left shoulder. Pulse was in the 150s and she was given 10 of diltiazem with improvement of chest pain. Onset of chest pain was around 2 pm when she was lifting a heavy box at her home. Pain waxed and waned over several hours before she sought help. No relieving or worsening factors that she recalls. Did feel her heart racing at the time and at the urgent care (noted that the urgent has her vitals with pulse of 80 but she says they were having trouble).  She reports that she gets occasional exertional chest discomfort that is similar to this in nature but this was much more severe. Also has episodic palpitations that she states she was told was likely afib but she has never caught it on a monitor. Not on anticoagulation. No aspirin.  Was feeling well without any other complaints prior to this afternoon.   Received diltiazem in transit, nitro in ER and is now chest pain free. Telemetry reviews afib with rates in the 80s.  EKG revealed afib with nonspecific minimal lateral ST changes. CXR was without acute cardiopulmonary abnormalities. Labs are significant for negative trop..   Past Medical History  Diagnosis Date  . Hypertension   . Palpitations   . SVT (supraventricular tachycardia)   . Intermittent atrial fibrillation      Surgical History:  Past Surgical History  Procedure  Laterality Date  . Tonsillectomy    . Abdominal hysterectomy    . Dilation and curettage of uterus    . Colonoscopy    . Bilateral oophorectomy    . Great toe arthrodesis, interphalangeal joint      bilat-toenails removed  . Anterior and posterior repair    . Uterine suspension    . Atrial ablation surgery  2001    cardiac ablation  . Breast reduction surgery  10/05/2012    Procedure: MAMMARY REDUCTION  (BREAST);  Surgeon: Etter Sjogren, MD;  Location: [facility];  Service: Plastics;  Laterality: Bilateral;  . Breast surgery      lt br bx  . Cardiac catheterization       Home Meds: Prior to Admission medications   Medication Sig Start Date End Date Taking? Authorizing Provider  carvedilol (COREG) 6.25 MG tablet Take 3 tablets (18.75 mg total) by mouth 2 (two) times daily with a meal. 01/28/14  Yes Laurey Morale, MD  Chlorphen-PE-Acetaminophen (TYLENOL ALLERGY MULTI-SYMPTOM PO) Take 2 tablets by mouth at bedtime as needed (cold symptoms).   Yes Historical Provider, MD  gabapentin (NEURONTIN) 100 MG capsule Take 100 mg by mouth at bedtime.  02/14/14  Yes Historical Provider, MD  ibuprofen (ADVIL,MOTRIN) 200 MG tablet Take 600-800 mg by mouth every 6 (six) hours as needed for mild pain.   Yes Historical Provider, MD  lisinopril (PRINIVIL,ZESTRIL) 20 MG tablet Take 1 tablet (20 mg total) by mouth daily. 01/28/14  Yes Laurey Morale, MD  Pseudoeph-Doxylamine-DM-APAP (NYQUIL PO) Take 30 mLs by mouth at bedtime as needed (cold symptoms).   Yes Historical Provider, MD    Allergies: No Known Allergies  History   Social History  . Marital Status: Divorced    Spouse Name: N/A    Number of Children: N/A  . Years of Education: N/A   Occupational History  . Not on file.   Social History Main Topics  . Smoking status: Never Smoker   . Smokeless tobacco: Not on file  . Alcohol Use: No     Comment: occasional  . Drug Use: No  . Sexual Activity: Not on file   Other Topics  Concern  . Not on file   Social History Narrative  . No narrative on file     Family History  Problem Relation Age of Onset  . Heart attack Father 2952    deceased  . Hypertension Mother 2477    CAB multi system organ failure    Review of Systems: General: negative for chills, fever, night sweats or weight changes.  Cardiovascular: negative for chest pain, shortness of breath, dyspnea on exertion, edema, orthopnea, palpitations, paroxysmal nocturnal dyspnea  Dermatological: negative for rash Respiratory: negative for cough or wheezing Urologic: negative for hematuria Abdominal: negative for nausea, vomiting, diarrhea, bright red blood per rectum, melena, or hematemesis Neurologic: negative for visual changes, syncope, or dizziness All other systems reviewed and are otherwise negative except as noted above.  Labs:   Lab Results  Component Value Date   WBC 8.3 03/28/2014   HGB 13.2 03/28/2014   HCT 39.0 03/28/2014   MCV 77.2* 03/28/2014   PLT 196 03/28/2014    Recent Labs Lab 03/28/14 1815  NA 145  K 4.2  CL 106  CO2 24  BUN 18  CREATININE 1.03  CALCIUM 9.8  GLUCOSE 101*   No results found for this basename: CKTOTAL, CKMB, TROPONINI,  in the last 72 hours No results found for this basename: CHOL, HDL, LDLCALC, TRIG   No results found for this basename: DDIMER    Radiology/Studies:  Dg Chest Port 1 View  03/28/2014   CLINICAL DATA:  Chest pain.  EXAM: PORTABLE CHEST - 1 VIEW  COMPARISON:  February 06, 2014.  FINDINGS: The heart size and mediastinal contours are within normal limits. Both lungs are clear. No pneumothorax or pleural effusion is noted. The visualized skeletal structures are unremarkable.  IMPRESSION: No acute cardiopulmonary abnormality seen.   Electronically Signed   By: Roque LiasJames  Green M.D.   On: 03/28/2014 18:29     EKG: afib with rate controlled, LVH by avl criteria.  Physical Exam: Blood pressure 135/77, pulse 100, temperature 97.8 F (36.6 C), temperature  source Oral, resp. rate 21, height 5\' 6"  (1.676 m), weight 73.936 kg (163 lb), SpO2 100.00%. General: Well developed, well nourished, in no acute distress. Head: Normocephalic, atraumatic, sclera non-icteric, nares are without discharge Neck: Supple. Negative for carotid bruits. JVD not elevated. Lungs: Clear bilaterally to auscultation without wheezes, rales, or rhonchi. Breathing is unlabored. Heart: irreg, irreg. No murmurs, rubs, or gallops appreciated. Abdomen: Soft, non-tender, non-distended with normoactive bowel sounds. No rebound/guarding. No obvious abdominal masses. Msk:  Strength and tone appear normal for age. Extremities: No edema. No clubbing or cyanosis. Distal pedal pulses are 2+ and equal bilaterally. Neuro: Alert and oriented X 3. Moves all extremities spontaneously. Psych:  Responds to questions appropriately with a normal affect.    ASSESSMENT AND PLAN:  Problem List 1. Atrial fibrillation,  with uncontrolled rate 2. Chest pain, likely related to #1 3. HTN 4. Cardiomyopathy without evidence of heart failure, EF of 45% by echo 11/2013, mild dil LA, MRI with atypical septal LGE (?myocarditis vs. Infiltrative process). Nonischemic by prior cath.  68 y.o. 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 Found to be in afib with RVR, rates now controlled.  Regarding her afib, she was been in the past presumptively diagnosed with afib but now it has been clearly documented. Prior echo demonstrated mild LAE, mildly reduced EF at 45% but nonischemic based upon prior catheterization. Check TSH, no indication for repeat echo a this time. Does have elevated CHADS2Vasc score based upon age, gender and hypertension. Longterm anticoagulation may be warranted. Briefly discussed pros and cons of anticoag and the reasons necessary but she seemed somewhat  overwhelmed with the discussion. WIll start on heparin (refuses lovenox) as I think she is appropriate for long term anticoagulation. If she does not convert on her own, discussion regarding rate vs. Rhythm control needs to be done. Continue carvedilol for now.  Regarding chest pain, likely due to RVR. Has some exertional symptoms at rest (a little unclear the cause of this considering no obstructive epicardial CAD) but worsened with the heart rates in the 150s. Serial enzymes to rule out infarct. NPO just in case further testing is desired (TEE/DCCV, stress testing).  Continue ACEI for HTN, cardiomyopathy.  Full code. Heparin for prophylaxis.  Signed, Ardis Rowan MD 03/28/2014, 8:36 PM

## 2014-03-28 NOTE — ED Provider Notes (Signed)
CSN: [id number]     Arrival date & time 03/28/14  1746 History   First MD Initiated Contact with Patient 03/28/14 1758     Chief Complaint  Patient presents with  . Chest Pain  . Atrial Fibrillation     (Consider location/radiation/quality/duration/timing/severity/associated sxs/prior Treatment) Patient is a 68 y.o. female presenting with chest pain. The history is provided by the patient.  Chest Pain Chest pain location: central. Pain quality: pressure and tightness   Radiates to: shoulder left. Pain radiates to the back: no   Pain severity:  Severe Onset quality:  Sudden Timing:  Constant Progression:  Improving Chronicity:  New Context: at rest   Relieved by:  Rest and aspirin Worsened by:  Exertion Associated symptoms: nausea   Associated symptoms: no abdominal pain, no back pain, no cough, no dizziness, no fever, no headache, no shortness of breath and not vomiting     68 yo F with CP. Onset few hours ago. Central. Pressure. Radiates to shoulder. Not relieved with rest. Associated SOB. Improved with ASA en route with EMS. Has not tried NTG.  No cough or fever No h/o DVT/PE. Not worse with inspiration. No edema No radiation to back.  Reports h/o chest pressure that occurs when exerting herself. Resolves with rest. This has been ongoing for many years and is unchanged until today.   Reportedly with A. Fib en route. Received cardizem 10mg  with improved HR to low 100's.  CP currently 2-3/10.    Past Medical History  Diagnosis Date  . Hypertension   . Palpitations   . SVT (supraventricular tachycardia)   . Intermittent atrial fibrillation    Past Surgical History  Procedure Laterality Date  . Tonsillectomy    . Abdominal hysterectomy    . Dilation and curettage of uterus    . Colonoscopy    . Bilateral oophorectomy    . Great toe arthrodesis, interphalangeal joint      bilat-toenails removed  . Anterior and posterior repair    . Uterine suspension    . Atrial  ablation surgery  2001    cardiac ablation  . Breast reduction surgery  10/05/2012    Procedure: MAMMARY REDUCTION  (BREAST);  Surgeon: Etter Sjogren, MD;  Location: [facility];  Service: Plastics;  Laterality: Bilateral;  . Breast surgery      lt br bx  . Cardiac catheterization     Family History  Problem Relation Age of Onset  . Heart attack Father 25    deceased  . Hypertension Mother 35    CAB multi system organ failure   History  Substance Use Topics  . Smoking status: Never Smoker   . Smokeless tobacco: Not on file  . Alcohol Use: No     Comment: occasional   OB History   Grav Para Term Preterm Abortions TAB SAB Ect Mult Living                 Review of Systems  Constitutional: Negative for fever and chills.  HENT: Negative for congestion and rhinorrhea.   Respiratory: Negative for cough and shortness of breath.   Cardiovascular: Positive for chest pain. Negative for leg swelling.  Gastrointestinal: Positive for nausea. Negative for vomiting, abdominal pain and diarrhea.  Genitourinary: Negative for flank pain and difficulty urinating.  Musculoskeletal: Negative for back pain.  Neurological: Negative for dizziness and headaches.  All other systems reviewed and are negative.     Allergies  Review of patient's allergies indicates  no known allergies.  Home Medications   Prior to Admission medications   Medication Sig Start Date End Date Taking? Authorizing Provider  carvedilol (COREG) 6.25 MG tablet Take 3 tablets (18.75 mg total) by mouth 2 (two) times daily with a meal. 01/28/14  Yes Laurey Morale, MD  Chlorphen-PE-Acetaminophen (TYLENOL ALLERGY MULTI-SYMPTOM PO) Take 2 tablets by mouth at bedtime as needed (cold symptoms).   Yes Historical Provider, MD  gabapentin (NEURONTIN) 100 MG capsule Take 100 mg by mouth at bedtime.  02/14/14  Yes Historical Provider, MD  ibuprofen (ADVIL,MOTRIN) 200 MG tablet Take 600-800 mg by mouth every 6 (six) hours  as needed for mild pain.   Yes Historical Provider, MD  lisinopril (PRINIVIL,ZESTRIL) 20 MG tablet Take 1 tablet (20 mg total) by mouth daily. 01/28/14  Yes Laurey Morale, MD  Pseudoeph-Doxylamine-DM-APAP (NYQUIL PO) Take 30 mLs by mouth at bedtime as needed (cold symptoms).   Yes Historical Provider, MD   BP 135/84  Pulse 116  Temp(Src) 97.8 F (36.6 C) (Oral)  Resp 18  Ht 5\' 6"  (1.676 m)  Wt 163 lb (73.936 kg)  BMI 26.32 kg/m2  SpO2 100% Physical Exam  Nursing note and vitals reviewed. Constitutional: She is oriented to person, place, and time. She appears well-developed and well-nourished. No distress.  HENT:  Head: Normocephalic and atraumatic.  Eyes: Conjunctivae are normal. Right eye exhibits no discharge. Left eye exhibits no discharge.  Cardiovascular: Normal heart sounds and intact distal pulses.   Irregularly irregular. HR ~105  Pulmonary/Chest: Effort normal and breath sounds normal. No stridor. No respiratory distress. She has no wheezes. She has no rales.  Abdominal: Soft. She exhibits no distension. There is no tenderness. There is no guarding.  Musculoskeletal: She exhibits no edema and no tenderness.  Neurological: She is alert and oriented to person, place, and time.  Skin: Skin is warm and dry.  Psychiatric: She has a normal mood and affect. Her behavior is normal.    ED Course  Procedures (including critical care time) Labs Review Labs Reviewed  CBC - Abnormal; Notable for the following:    MCV 77.2 (*)    All other components within normal limits  BASIC METABOLIC PANEL - Abnormal; Notable for the following:    Glucose, Bld 101 (*)    GFR calc non Af Amer 55 (*)    GFR calc Af Amer 63 (*)    All other components within normal limits  TROPONIN I  TROPONIN I  TROPONIN I  CBC  BASIC METABOLIC PANEL  PROTIME-INR  LIPID PANEL  TSH  HEPARIN LEVEL (UNFRACTIONATED)  Rosezena Sensor, ED    Imaging Review Dg Chest Port 1 View  03/28/2014   CLINICAL  DATA:  Chest pain.  EXAM: PORTABLE CHEST - 1 VIEW  COMPARISON:  February 06, 2014.  FINDINGS: The heart size and mediastinal contours are within normal limits. Both lungs are clear. No pneumothorax or pleural effusion is noted. The visualized skeletal structures are unremarkable.  IMPRESSION: No acute cardiopulmonary abnormality seen.   Electronically Signed   By: Roque Lias M.D.   On: 03/28/2014 18:29     EKG Interpretation None       Date: 03/28/2014  Rate: 87  Rhythm: atrial fibrillation  QRS Axis: normal  Intervals: normal  ST/T Wave abnormalities: nonspecific T wave changes  Conduction Disutrbances:none  Narrative Interpretation:   Old EKG Reviewed: Similar to prior except for nonspecific T wave flattening aVF. and with A. fib.  MDM   Final diagnoses:  Atrial fibrillation  Chest pain    68 yo F with a. Fib and chest pain. Rate controlled after cardizem with EMS. Chest pain improved. Further improvement with NTG and morphine.  EKG as above.  Received ASA Do not suspect infectious etiology, PE, or dissection.  Admit to cardiology for further w/u and mgmt.     Discussed case with Dr. Micheline Mazeocherty who is in agreement with assessment and plan.     Stevie Kernyan Davene Jobin, MD 03/29/14 [phone]

## 2014-03-28 NOTE — ED Notes (Signed)
PT brought here via [facility] for chest pain/afib.  Hx of intermittent afib/ablation.  Had acute onset L shoulder/jaw cp that radiated to L arm.  Went to [employer] office who called EMS.  Pt was initially afib with (80's to 130's).  HR increased to 150's with chest pain 10/10.  Given 10 mg cardizem with relief.

## 2014-03-28 NOTE — Telephone Encounter (Signed)
Pt walked into office with lt sided chest pain and pressure radiating into lt shoulder and ? Jaw; pt said her teeth on lt side hurt. Pt is SOB. O2 was started. Pt taken to exam room via w/c and BP 138/80 P 80 pulse ox 96% T 97.5. Pt has hx of irregular heart beat. 911 called and pt transported to [facility] ED by ambulance; pt has family member with her.

## 2014-03-28 NOTE — ED Notes (Signed)
Pts Son Zollie Beckers can be reached at   Home: [phone]  Cell: [phone]

## 2014-03-28 NOTE — Progress Notes (Signed)
ANTICOAGULATION CONSULT NOTE - Initial Consult  Pharmacy Consult for heparin  Indication: chest pain/ACS  No Known Allergies  Patient Measurements: Height: 5\' 6"  (167.6 cm) Weight: 166 lb 11.2 oz (75.615 kg) IBW/kg (Calculated) : 59.3 Heparin Dosing Weight:   Vital Signs: Temp: 97.5 F (36.4 C) (06/04 2247) Temp src: Oral (06/04 2247) BP: 134/99 mmHg (06/04 2247) Pulse Rate: 91 (06/04 2247)  Labs:  Recent Labs  03/28/14 1815  HGB 13.2  HCT 39.0  PLT 196  CREATININE 1.03    Estimated Creatinine Clearance: 54.3 ml/min (by C-G formula based on Cr of 1.03).   Medical History: Past Medical History  Diagnosis Date  . Hypertension   . Palpitations   . SVT (supraventricular tachycardia)   . Intermittent atrial fibrillation     Medications:  Prescriptions prior to admission  Medication Sig Dispense Refill  . carvedilol (COREG) 6.25 MG tablet Take 3 tablets (18.75 mg total) by mouth 2 (two) times daily with a meal.  540 tablet  2  . Chlorphen-PE-Acetaminophen (TYLENOL ALLERGY MULTI-SYMPTOM PO) Take 2 tablets by mouth at bedtime as needed (cold symptoms).      . gabapentin (NEURONTIN) 100 MG capsule Take 100 mg by mouth at bedtime.       Marland Kitchen ibuprofen (ADVIL,MOTRIN) 200 MG tablet Take 600-800 mg by mouth every 6 (six) hours as needed for mild pain.      Marland Kitchen lisinopril (PRINIVIL,ZESTRIL) 20 MG tablet Take 1 tablet (20 mg total) by mouth daily.  90 tablet  2  . Pseudoeph-Doxylamine-DM-APAP (NYQUIL PO) Take 30 mLs by mouth at bedtime as needed (cold symptoms).        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 Hx of svt and intermittent afib. Heparin per acs protocol Goal of Therapy:  Heparin level 0.3-0.7 units/ml Monitor platelets by anticoagulation protocol: Yes   Plan:  Heparin 3000 units x1 then 1050/hr 6 hour HL then daily HL and cbc starting on 6-6  Janice Coffin 03/28/2014,11:19 PM

## 2014-03-29 DIAGNOSIS — I4891 Unspecified atrial fibrillation: Secondary | ICD-10-CM

## 2014-03-29 DIAGNOSIS — R110 Nausea: Secondary | ICD-10-CM | POA: Diagnosis not present

## 2014-03-29 DIAGNOSIS — R079 Chest pain, unspecified: Secondary | ICD-10-CM | POA: Diagnosis not present

## 2014-03-29 DIAGNOSIS — I428 Other cardiomyopathies: Secondary | ICD-10-CM

## 2014-03-29 DIAGNOSIS — R0602 Shortness of breath: Secondary | ICD-10-CM | POA: Diagnosis not present

## 2014-03-29 LAB — TSH: TSH: 2.36 u[IU]/mL (ref 0.350–4.500)

## 2014-03-29 LAB — BASIC METABOLIC PANEL
BUN: 22 mg/dL (ref 6–23)
CO2: 24 mEq/L (ref 19–32)
Calcium: 9.2 mg/dL (ref 8.4–10.5)
Chloride: 109 mEq/L (ref 96–112)
Creatinine, Ser: 1.05 mg/dL (ref 0.50–1.10)
GFR, EST AFRICAN AMERICAN: 62 mL/min — AB (ref >90)
GFR, EST NON AFRICAN AMERICAN: 53 mL/min — AB (ref >90)
GLUCOSE: 101 mg/dL — AB (ref 70–99)
Potassium: 4.2 mEq/L (ref 3.7–5.3)
Sodium: 146 mEq/L (ref 137–147)

## 2014-03-29 LAB — CBC
HEMATOCRIT: 36.5 % (ref 36.0–46.0)
Hemoglobin: 12.3 g/dL (ref 12.0–15.0)
MCH: 26.1 pg (ref 26.0–34.0)
MCHC: 33.7 g/dL (ref 30.0–36.0)
MCV: 77.5 fL — AB (ref 78.0–100.0)
Platelets: 201 10*3/uL (ref 150–400)
RBC: 4.71 MIL/uL (ref 3.87–5.11)
RDW: 15.3 % (ref 11.5–15.5)
WBC: 7.6 10*3/uL (ref 4.0–10.5)

## 2014-03-29 LAB — TROPONIN I: Troponin I: <0.3 ng/mL (ref <0.30)

## 2014-03-29 LAB — LIPID PANEL
CHOL/HDL RATIO: 3.3 ratio
Cholesterol: 169 mg/dL (ref 0–200)
HDL: 52 mg/dL (ref >39)
LDL Cholesterol: 103 mg/dL — ABNORMAL HIGH (ref 0–99)
Triglycerides: 68 mg/dL (ref <150)
VLDL: 14 mg/dL (ref 0–40)

## 2014-03-29 LAB — HEPARIN LEVEL (UNFRACTIONATED): Heparin Unfractionated: 1 IU/mL — ABNORMAL HIGH (ref 0.30–0.70)

## 2014-03-29 LAB — PROTIME-INR
INR: 1.13 (ref 0.00–1.49)
Prothrombin Time: 14.3 seconds (ref 11.6–15.2)

## 2014-03-29 MED ORDER — DILTIAZEM HCL ER COATED BEADS 120 MG PO CP24
120.0000 mg | ORAL_CAPSULE | Freq: Every day | ORAL | Status: DC
  Administered 2014-03-29 – 2014-03-30 (×2): 120 mg via ORAL
  Filled 2014-03-29 (×3): qty 1

## 2014-03-29 MED ORDER — RIVAROXABAN 20 MG PO TABS
20.0000 mg | ORAL_TABLET | Freq: Every day | ORAL | Status: DC
  Administered 2014-03-29: 20 mg via ORAL
  Filled 2014-03-29 (×2): qty 1

## 2014-03-29 MED ORDER — MAGNESIUM SULFATE IN D5W 10-5 MG/ML-% IV SOLN
1.0000 g | Freq: Once | INTRAVENOUS | Status: AC
  Administered 2014-03-29: 1 g via INTRAVENOUS
  Filled 2014-03-29: qty 100

## 2014-03-29 NOTE — Progress Notes (Signed)
Patient ID: KUMBA DIEGO, female   DOB: October 03, 1946, 68 y.o.   MRN: [id number]   SUBJECTIVE: Patient has had a long history of periodic fluttering and tachypalpitations, happens multiple times a week recently.  Monitoring had not shown arrhythmias prior.   Yesterday, while lifting a heavy load, patient felt her heart face and developed chest pain that was severe.  She ended up going to the emergency room where she was found to be in atrial fibrillation with RVR.  She was started on a heparin gtt.  She was kept on her home Coreg.  HR is now in the 90s in atrial fibrillation.  She feels chest pressure still when she walks to the bathroom.   Scheduled Meds: . carvedilol  18.75 mg Oral BID WC  . diltiazem  120 mg Oral Daily  . gabapentin  100 mg Oral QHS  . lisinopril  20 mg Oral Daily   Continuous Infusions:  PRN Meds:.nitroGLYCERIN, nitroGLYCERIN    Filed Vitals:   03/28/14 2215 03/28/14 2247 03/29/14 0603 03/29/14 0820  BP: 148/102 134/99 133/70 124/68  Pulse:  91 87 97  Temp:  97.5 F (36.4 C) 97.4 F (36.3 C)   TempSrc:  Oral Oral   Resp:  20 20   Height:  5\' 6"  (1.676 m)    Weight:  166 lb 11.2 oz (75.615 kg) 166 lb 11.2 oz (75.615 kg)   SpO2: 98% 98% 98%     Intake/Output Summary (Last 24 hours) at 03/29/14 0915 Last data filed at 03/29/14 0657  Gross per 24 hour  Intake    222 ml  Output    625 ml  Net   -403 ml    LABS: Basic Metabolic Panel:  Recent Labs  46/95/07 1815 03/29/14 0556  NA 145 146  K 4.2 4.2  CL 106 109  CO2 24 24  GLUCOSE 101* 101*  BUN 18 22  CREATININE 1.03 1.05  CALCIUM 9.8 9.2   Liver Function Tests: No results found for this basename: AST, ALT, ALKPHOS, BILITOT, PROT, ALBUMIN,  in the last 72 hours No results found for this basename: LIPASE, AMYLASE,  in the last 72 hours CBC:  Recent Labs  03/28/14 1815 03/29/14 0556  WBC 8.3 7.6  HGB 13.2 12.3  HCT 39.0 36.5  MCV 77.2* 77.5*  PLT 196 201   Cardiac Enzymes:  Recent  Labs  03/28/14 2345 03/29/14 0556  TROPONINI <0.30 <0.30   BNP: No components found with this basename: POCBNP,  D-Dimer: No results found for this basename: DDIMER,  in the last 72 hours Hemoglobin A1C: No results found for this basename: HGBA1C,  in the last 72 hours Fasting Lipid Panel:  Recent Labs  03/29/14 0556  CHOL 169  HDL 52  LDLCALC 103*  TRIG 68  CHOLHDL 3.3   Thyroid Function Tests:  Recent Labs  03/29/14 0556  TSH 2.360   Anemia Panel: No results found for this basename: VITAMINB12, FOLATE, FERRITIN, TIBC, IRON, RETICCTPCT,  in the last 72 hours  RADIOLOGY: Dg Chest Port 1 View  03/28/2014   CLINICAL DATA:  Chest pain.  EXAM: PORTABLE CHEST - 1 VIEW  COMPARISON:  February 06, 2014.  FINDINGS: The heart size and mediastinal contours are within normal limits. Both lungs are clear. No pneumothorax or pleural effusion is noted. The visualized skeletal structures are unremarkable.  IMPRESSION: No acute cardiopulmonary abnormality seen.   Electronically Signed   By: Roque Lias M.D.   On: 03/28/2014 18:29  PHYSICAL EXAM General: NAD Neck: No JVD, no thyromegaly or thyroid nodule.  Lungs: Clear to auscultation bilaterally with normal respiratory effort. CV: Nondisplaced PMI.  Heart irregular S1/S2, no S3/S4, no murmur.  No peripheral edema.  No carotid bruit.  Normal pedal pulses.  Abdomen: Soft, nontender, no hepatosplenomegaly, no distention.  Neurologic: Alert and oriented x 3.  Psych: Normal affect. Extremities: No clubbing or cyanosis.   TELEMETRY: Reviewed telemetry pt in atrial fibrillation, HR 90s  ASSESSMENT AND PLAN: 68 yo with history of mild nonischemic cardiomyopathy and history of undiagnosed tachypalpitations presented with atrial fibrillation/RVR and chest pain.  1. Atrial fibrillation: This was first diagnosed yesterday, but she has been having increasing trouble with tachypalpitations.  She had not wanted to wear a 30 day monitor so I had  been looking into getting her a loop recorder given past syncopal episode and abnormal cardiac MRI.  However, yesterday her heart began to race very fast with severe chest pain, she was found to be in atrial fibrillation with RVR.  Rate is now controlled but she still gets chest pressure with walking.  She remains in atrial fibrillation.  - She may have been in atrial fibrillation on and off in the recent past based on symptoms, would not cardiovert without ruling out LV thrombus.  - I will continue Coreg and add diltiazem CD for rate control (EF 55% on recent cardiac MRI).   - I will stop heparin gtt and start Xarelto 20 mg daily (CHADSVASC = 3, gender/age/HTN).   - If she comes out of atrial fibrillation overnight, would start dronedarone 400 mg bid and let her go home in am.  - If she remains in atrial fibrillation but is less symptomatic over weekend and wants to go home, can go home on Xarelto + Coreg/diltiazem CD and followup for consideration of TEE-guided cardioversion.  - If she remains in atrial fibrillation and symptomatic, would wait for 3 doses Xarelto (Monday) and do TEE-guided cardioversion.  Would send home on dronedarone to keep in NSR.  2. Cardiomyopathy: Uncertain etiology, has been recognized for a number of years now. LHC in 2007 with no significant CAD. Most recent echo in 2/15 shows a rather unusual pattern of wall motion abnormalities. She has akinesis and thinning of the basal to mid anteroseptum and inferoseptum, as well as basal inferior akinesis, overall EF 45%. Given the unusual wall motion pattern and NSVT run noted on holter, cardiac MRI was done. This showed EF 55% with basal to mid inferoseptal and basal to mid anteroseptal akinesis/dyskinesis. There was mid-wall delayed enhancement in the basal anteroseptum and basal inferoseptum. This pattern could possibly represent prior myocarditis. However, it would also be possible for this to be cardiac sarcoidosis. The pattern was not  consistent with CAD. She has no history of sarcoidosis and CT chest recently did not show any sign of sarcoidosis. She is not volume overloaded on exam.  - She will continue current doses of Coreg and lisinopril.  3. Chest pressure: Patient has had chronic periodic chest pressure in past, worse with atrial fibrillation yesterday.  Prior ischemic workups with cath and Cardiolite negative.  She ruled out for MI and ECG did not show ischemic changes.  Would lean towards outpatient Cardiolite.   Laurey MoraleDalton S Davionna Blacksher 03/29/2014 9:25 AM

## 2014-03-29 NOTE — Progress Notes (Signed)
ANTICOAGULATION CONSULT NOTE - Initial Consult  Pharmacy Consult for xarelto  Indication: afib  No Known Allergies  Patient Measurements: Height: 5\' 6"  (167.6 cm) Weight: 166 lb 11.2 oz (75.615 kg) IBW/kg (Calculated) : 59.3 Heparin Dosing Weight:   Vital Signs: Temp: 97.4 F (36.3 C) (06/05 0603) Temp src: Oral (06/05 0603) BP: 124/68 mmHg (06/05 0820) Pulse Rate: 97 (06/05 0820)  Labs:  Recent Labs  03/28/14 1815 03/28/14 2345 03/29/14 0556  HGB 13.2  --  12.3  HCT 39.0  --  36.5  PLT 196  --  201  LABPROT  --   --  14.3  INR  --   --  1.13  HEPARINUNFRC  --   --  1.00*  CREATININE 1.03  --  1.05  TROPONINI  --  <0.30 <0.30    Estimated Creatinine Clearance: 53.3 ml/min (by C-G formula based on Cr of 1.05).   Medical History: Past Medical History  Diagnosis Date  . Hypertension   . Palpitations   . SVT (supraventricular tachycardia)   . Intermittent atrial fibrillation     Medications:  Prescriptions prior to admission  Medication Sig Dispense Refill  . carvedilol (COREG) 6.25 MG tablet Take 3 tablets (18.75 mg total) by mouth 2 (two) times daily with a meal.  540 tablet  2  . Chlorphen-PE-Acetaminophen (TYLENOL ALLERGY MULTI-SYMPTOM PO) Take 2 tablets by mouth at bedtime as needed (cold symptoms).      . gabapentin (NEURONTIN) 100 MG capsule Take 100 mg by mouth at bedtime.       Marland Kitchen ibuprofen (ADVIL,MOTRIN) 200 MG tablet Take 600-800 mg by mouth every 6 (six) hours as needed for mild pain.      Marland Kitchen lisinopril (PRINIVIL,ZESTRIL) 20 MG tablet Take 1 tablet (20 mg total) by mouth daily.  90 tablet  2  . Pseudoeph-Doxylamine-DM-APAP (NYQUIL PO) Take 30 mLs by mouth at bedtime as needed (cold symptoms).        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 She has been dx with afib. She was started on IV heparin but now will be transitioning to xarelto. Her renal function is normal so we'll start with regular  dose   Plan:   Dc heparin Start Xarelto 20mg  PO q24

## 2014-03-29 NOTE — ED Provider Notes (Signed)
Medical screening examination/treatment/procedure(s) were conducted as a shared visit with resident-physician practitioner(s) and myself.  I personally evaluated the patient during the encounter.  Pt is a 68 y.o. female with pmhx as above presenting with afib W/ RVR and CP.  HR improved after C[place] by EMS, but CP continued in ED was only slightly improved w/ NTG.  Pt in NAD on my PE, lungs clear, HR controlled. Will admit to cardiology for ACS r/o.     EKG Interpretation  Date/Time:  Thursday March 28 2014 17:56:18 EDT Ventricular Rate:  87 PR Interval:    QRS Duration: 85 QT Interval:  389 QTC Calculation: 468 R Axis:   17 Text Interpretation:  Atrial fibrillation Ventricular premature complex Left ventricular hypertrophy Anterior infarct, old Prior EKG w/ sinus rhythm.  Confirmed by Micheline Maze  MD, [place] [phone]) on 03/29/2014 1:46:21 PM        Shanna Cisco, MD 03/29/14 [phone]

## 2014-03-29 NOTE — Progress Notes (Signed)
UR completed 

## 2014-03-29 NOTE — Progress Notes (Signed)
CARE MANAGEMENT NOTE 03/29/2014  Patient:  Amy Adkins, Amy Adkins   Account Number:  [id number]  Date Initiated:  03/29/2014  Documentation initiated by:  Laurena Slimmer  Subjective/Objective Assessment:   Medication assistance with Xarelto     Action/Plan:   Anticipated DC Date:  03/30/2014   Anticipated DC Plan:  [place]  CM consult  Medication Assistance      Choice offered to / List presented to:  C-1 Patient           Status of service:   Medicare Important Message given?   (If response is "NO", the following Medicare IM given date fields will be blank) Date Medicare IM given:   Date Additional Medicare IM given:    Discharge Disposition:  HOME/SELF CARE  Per UR Regulation:    If discussed at Long Length of Stay Meetings, dates discussed:    Comments:  03/29/14 2010 W. Shae Hinnenkamp RN BSN [phone] CM consulted reqarding medication assistance with Xarelto. Pt admitted with chest pain, palpitations. Met with patient at bedside confirmed information. Discussed Xeralto 30 free trail offer, patient verbalized understanding and apprciates assistance.  Successfully Enrolled patient into [facility] program. Savings Card Printed and given to patient. Explained that the card can be used at any retail pharmacy with Xarelto prescription.  Pt has Medicare Part D explained, that prior authourization wiill be needed from Cardiologist for futhure prescription.  Pt verbalized understanding teach back done.   Pt denies any further questions or concerns at this time.

## 2014-03-29 NOTE — Progress Notes (Signed)
With ambulation, pt heart rate increases into the 130s-140s and pt feels chest discomfort.  Pt heart rate decreases within a few minutes of pt sitting, to 80s-90s.  Pt reports chest discomfort goes away a few minutes after sitting back down. Will continue to monitor and pass along in report.

## 2014-03-30 ENCOUNTER — Encounter (HOSPITAL_COMMUNITY): Payer: Self-pay | Admitting: Nurse Practitioner

## 2014-03-30 ENCOUNTER — Telehealth: Payer: Self-pay | Admitting: Physician Assistant

## 2014-03-30 DIAGNOSIS — R079 Chest pain, unspecified: Secondary | ICD-10-CM | POA: Diagnosis not present

## 2014-03-30 DIAGNOSIS — R0789 Other chest pain: Secondary | ICD-10-CM

## 2014-03-30 MED ORDER — LISINOPRIL 5 MG PO TABS
5.0000 mg | ORAL_TABLET | Freq: Every day | ORAL | Status: DC
  Administered 2014-03-30: 5 mg via ORAL
  Filled 2014-03-30: qty 1

## 2014-03-30 MED ORDER — DILTIAZEM HCL ER COATED BEADS 120 MG PO CP24: 120.0000 mg | ORAL_CAPSULE | Freq: Every day | ORAL | Status: DC

## 2014-03-30 MED ORDER — LISINOPRIL 5 MG PO TABS: 5.0000 mg | ORAL_TABLET | Freq: Every day | ORAL | Status: DC

## 2014-03-30 MED ORDER — NITROGLYCERIN 0.4 MG SL SUBL: 0.4000 mg | SUBLINGUAL_TABLET | SUBLINGUAL | Status: AC | PRN

## 2014-03-30 MED ORDER — CARVEDILOL 12.5 MG PO TABS: 18.7500 mg | ORAL_TABLET | Freq: Two times a day (BID) | ORAL | Status: DC

## 2014-03-30 MED ORDER — RIVAROXABAN 20 MG PO TABS: 20.0000 mg | ORAL_TABLET | Freq: Every day | ORAL | Status: DC

## 2014-03-30 NOTE — Progress Notes (Signed)
Patient ID: Amy Adkins, female   DOB: 02-06-1946, 68 y.o.   MRN: [id number]    SUBJECTIVE: Patient has had a long history of periodic fluttering and tachypalpitations, happens multiple times a week recently.  Monitoring had not shown arrhythmias prior.   Thursday, while lifting a heavy load, patient felt her heart face and developed chest pain that was severe.  She ended up going to the emergency room where she was found to be in atrial fibrillation with RVR.  She was started on a heparin gtt and later Xarelto.  She was kept on her home Coreg and diltiazem CD was added.  HR is now in the 70s, still in atrial fibrillation.  She is not longer feeling chest pressure.  No dyspnea.   Scheduled Meds: . carvedilol  18.75 mg Oral BID WC  . diltiazem  120 mg Oral Daily  . gabapentin  100 mg Oral QHS  . lisinopril  5 mg Oral Daily  . rivaroxaban  20 mg Oral Q supper   Continuous Infusions:  PRN Meds:.nitroGLYCERIN, nitroGLYCERIN    Filed Vitals:   03/29/14 1643 03/29/14 2100 03/30/14 0500 03/30/14 0700  BP: 127/63 91/58 100/69 98/59  Pulse: 59 58 61 79  Temp:  97.6 F (36.4 C) 97.6 F (36.4 C)   TempSrc:      Resp:  16 18   Height:      Weight:   164 lb (74.39 kg)   SpO2:  100% 98%     Intake/Output Summary (Last 24 hours) at 03/30/14 0936 Last data filed at 03/30/14 0500  Gross per 24 hour  Intake    600 ml  Output    850 ml  Net   -250 ml    LABS: Basic Metabolic Panel:  Recent Labs  78/29/5605/02/06 1815 03/29/14 0556  NA 145 146  K 4.2 4.2  CL 106 109  CO2 24 24  GLUCOSE 101* 101*  BUN 18 22  CREATININE 1.03 1.05  CALCIUM 9.8 9.2   Liver Function Tests: No results found for this basename: AST, ALT, ALKPHOS, BILITOT, PROT, ALBUMIN,  in the last 72 hours No results found for this basename: LIPASE, AMYLASE,  in the last 72 hours CBC:  Recent Labs  03/28/14 1815 03/29/14 0556  WBC 8.3 7.6  HGB 13.2 12.3  HCT 39.0 36.5  MCV 77.2* 77.5*  PLT 196 201   Cardiac  Enzymes:  Recent Labs  03/28/14 2345 03/29/14 0556  TROPONINI <0.30 <0.30   BNP: No components found with this basename: POCBNP,  D-Dimer: No results found for this basename: DDIMER,  in the last 72 hours Hemoglobin A1C: No results found for this basename: HGBA1C,  in the last 72 hours Fasting Lipid Panel:  Recent Labs  03/29/14 0556  CHOL 169  HDL 52  LDLCALC 103*  TRIG 68  CHOLHDL 3.3   Thyroid Function Tests:  Recent Labs  03/29/14 0556  TSH 2.360   Anemia Panel: No results found for this basename: VITAMINB12, FOLATE, FERRITIN, TIBC, IRON, RETICCTPCT,  in the last 72 hours  RADIOLOGY: Dg Chest Port 1 View  03/28/2014   CLINICAL DATA:  Chest pain.  EXAM: PORTABLE CHEST - 1 VIEW  COMPARISON:  February 06, 2014.  FINDINGS: The heart size and mediastinal contours are within normal limits. Both lungs are clear. No pneumothorax or pleural effusion is noted. The visualized skeletal structures are unremarkable.  IMPRESSION: No acute cardiopulmonary abnormality seen.   Electronically Signed   By: Fayrene FearingJames  Green M.D.   On: 03/28/2014 18:29    PHYSICAL EXAM General: NAD Neck: No JVD, no thyromegaly or thyroid nodule.  Lungs: Clear to auscultation bilaterally with normal respiratory effort. CV: Nondisplaced PMI.  Heart irregular S1/S2, no S3/S4, no murmur.  No peripheral edema.  No carotid bruit.  Normal pedal pulses.  Abdomen: Soft, nontender, no hepatosplenomegaly, no distention.  Neurologic: Alert and oriented x 3.  Psych: Normal affect. Extremities: No clubbing or cyanosis.   TELEMETRY: Reviewed telemetry pt in atrial fibrillation, HR 90s  ASSESSMENT AND PLAN: 68 yo with history of mild nonischemic cardiomyopathy and history of undiagnosed tachypalpitations presented with atrial fibrillation/RVR and chest pain.  1. Atrial fibrillation: This was first diagnosed Thursday, but she has been having increasing trouble with tachypalpitations.  She had not wanted to wear a 30 day  monitor so I had been looking into getting her a loop recorder given past syncopal episode and abnormal cardiac MRI.  However, Thursday her heart began to race very fast with severe chest pain, she was found to be in atrial fibrillation with RVR.  Rate is now controlled and she is no longer having chest pressure with walking.  She remains in atrial fibrillation.  - She may have been in atrial fibrillation on and off in the recent past based on symptoms, would not cardiovert without ruling out LV thrombus.  - I will continue Coreg and added diltiazem CD for rate control (EF 55% on recent cardiac MRI).  I will lower lisinopril dose to 5 mg daily with lower BP on diltiazem CD.  - She will continue Xarelto 20 mg daily (CHADSVASC = 3, gender/age/HTN).    - She wants to go home.  I offered her TEE-guided DCCV on Monday but she does not want to wait.  I will let her go home and see her next week, either arranging TEE-guided DCCV or DCCV in 1 month on Xarelto.  When she is cardioverted, will likely start dronedarone.   2. Cardiomyopathy: Uncertain etiology, has been recognized for a number of years now. LHC in 2007 with no significant CAD. Most recent echo in 2/15 shows a rather unusual pattern of wall motion abnormalities. She has akinesis and thinning of the basal to mid anteroseptum and inferoseptum, as well as basal inferior akinesis, overall EF 45%. Given the unusual wall motion pattern and NSVT run noted on holter, cardiac MRI was done. This showed EF 55% with basal to mid inferoseptal and basal to mid anteroseptal akinesis/dyskinesis. There was mid-wall delayed enhancement in the basal anteroseptum and basal inferoseptum. This pattern could possibly represent prior myocarditis. However, it would also be possible for this to be cardiac sarcoidosis. The pattern was not consistent with CAD. She has no history of sarcoidosis and CT chest recently did not show any sign of sarcoidosis. She is not volume overloaded on  exam.  3. Chest pressure: Patient has had chronic periodic chest pressure in past, worse with atrial fibrillation yesterday.  Chest pressure seemed to correlate with very high heart rates.  Prior ischemic workups with cath and Cardiolite negative.  She ruled out for MI and ECG did not show ischemic changes.  Would arrange outpatient Cardiolite.  4. Disposition: Home today.  Needs to be overbooked in to see me next week.   Needs outpatient Cardiolite.  Meds: Xarelto 20 daily, lisinopril 5 daily, Coreg 18.75 bid, diltiazem CD 120 daily.   Laurey Morale 03/30/2014 9:36 AM

## 2014-03-30 NOTE — Progress Notes (Signed)
Pt refused morning labs. Pt was educated on the importance of labs.

## 2014-03-30 NOTE — Progress Notes (Signed)
Pt had 7 beats of VTach while sleeping. Md on call paged. Will cont to monitor pt.

## 2014-03-30 NOTE — Discharge Instructions (Signed)
***  PLEASE REMEMBER TO BRING ALL OF YOUR MEDICATIONS TO EACH OF YOUR FOLLOW-UP OFFICE VISITS.  

## 2014-03-30 NOTE — Discharge Summary (Signed)
Discharge Summary   Patient ID: Amy Adkins,  MRN: [id number], DOB/AGE: 1945/11/21 68 y.o.  Admit date: 03/28/2014 Discharge date: 03/30/2014  Primary Care Provider: Astrid Divine Primary Cardiologist: Golden Circle, MD   Discharge Diagnoses Principal Problem:   Atrial fibrillation  **Rate controlled.  **CHA2DS2VASc = 3 -->Xarelto started this admission.  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 More recently, she's been experiencing frequent palpitations and earlier this year wore a Holter monitor which showed a brief run of nonsustained ventricular tachycardia. Echocardiogram earlier this year showed mildly depressed LV function with multiple wall motion abnormalities. There was concern for possible myocarditis versus sarcoidosis an MRI of the heart was performed showing normal LV function without significant pathology. She was in her usual state of health until the afternoon of admission when after lifting a heavy box she developed chest discomfort and tachycardia. She went to a local urgent care and heart rate apparently in the 80s. Because of her symptoms, EMS was called [facility] was [facility]. Here, she was found to be in atrial fibrillation with rates in the 80s with nonspecific lateral ST changes. Troponin was normal. She was admitted for further evaluation.  Hospital Course  Following admission, patient remained in atrial fibrillation. She ruled out for myocardial infarction. Her beta blocker dose was up titrated and oral diltiazem therapy was added as well. She has achieved adequate rate control.  With a CHA2DS2VASc = 3, Xarelto therapy was initiated. Patient has remained in atrial fibrillation and has had no further  chest pain with adequate rate control. We have recommended transesophageal echocardiogram and cardioversion however she prefers to go home and as she is rate controlled and currently asymptomatic, this seems reasonable. She will be discharged home today in good condition and we will arrange for early followup next week to reconsider TEE post cardioversion versus cardioversion after 4 weeks of therapeutic anticoagulation. We will also arrange for outpatient stress testing given chest pain upon presentation.  Discharge Vitals Blood pressure 98/59, pulse 79, temperature 97.6 F (36.4 C), temperature source Oral, resp. rate 18, height 5\' 6"  (1.676 m), weight 164 lb (74.39 kg), SpO2 98.00%.  Filed Weights   03/28/14 2247 03/29/14 0603 03/30/14 0500  Weight: 166 lb 11.2 oz (75.615 kg) 166 lb 11.2 oz (75.615 kg) 164 lb (74.39 kg)   Labs  CBC  Recent Labs  03/28/14 1815 03/29/14 0556  WBC 8.3 7.6  HGB 13.2 12.3  HCT 39.0 36.5  MCV 77.2* 77.5*  PLT 196 201   Basic Metabolic Panel  Recent Labs  03/28/14 1815 03/29/14 0556  NA 145 146  K 4.2 4.2  CL 106 109  CO2 24 24  GLUCOSE 101* 101*  BUN 18 22  CREATININE 1.03 1.05  CALCIUM 9.8 9.2   Cardiac Enzymes  Recent Labs  03/28/14 2345 03/29/14 0556  TROPONINI <0.30 <0.30    Fasting Lipid Panel  Recent Labs  03/29/14 0556  CHOL 169  HDL 52  LDLCALC 103*  TRIG 68  CHOLHDL 3.3   Thyroid Function Tests  Recent Labs  03/29/14 0556  TSH 2.360   Disposition  Pt is being discharged home today in good condition.  Follow-up Plans & Appointments  Follow-up Information   Follow up with Astrid Divine, MD. (  as scheduled.)    Specialty:  Family Medicine   Contact information:   [address] [place] [address] [phone]       Follow up with Marca Anconaalton McLean, MD In 1 week. (we will arrange and contact you.)    Specialty:  Cardiology   Contact information:   [address] [place] [address] [phone]      Discharge Medications    Medication List    STOP taking these medications       ibuprofen 200 MG tablet  Commonly known as:  ADVIL,MOTRIN     NYQUIL PO     TYLENOL ALLERGY MULTI-SYMPTOM PO      TAKE these medications       carvedilol 12.5 MG tablet  Commonly known as:  COREG  Take 1.5 tablets (18.75 mg total) by mouth 2 (two) times daily with a meal.     diltiazem 120 MG 24 hr capsule  Commonly known as:  CARDIZEM CD  Take 1 capsule (120 mg total) by mouth daily.     gabapentin 100 MG capsule  Commonly known as:  NEURONTIN  Take 100 mg by mouth at bedtime.     lisinopril 5 MG tablet  Commonly known as:  PRINIVIL,ZESTRIL  Take 1 tablet (5 mg total) by mouth daily.     nitroGLYCERIN 0.4 MG SL tablet  Commonly known as:  NITROSTAT  Place 1 tablet (0.4 mg total) under the tongue every 5 (five) minutes as needed for chest pain.     rivaroxaban 20 MG Tabs tablet  Commonly known as:  XARELTO  Take 1 tablet (20 mg total) by mouth daily with supper.       Outstanding Labs/Studies  Lexiscan Myoview to be scheduled.  Duration of Discharge Encounter   Greater than 30 minutes including physician time.  Signed, Ok Anishristopher R Elsey Holts NP 03/30/2014, 10:11 AM

## 2014-03-30 NOTE — Telephone Encounter (Signed)
Melissa, pharmacist at [facility], called Saturday afternoon regarding patient's Xarelto that was sent in today at discharge. Apparently the prescription is over $290 and the patient is unwilling to pay for this. Melissa asked if there was a generic such as Plavix that we could use instead. I informed her this was for atrial fibrillation and thus Plavix would be insufficient for stroke prophylaxis. She told me that because the patient had a certain kind of Medicare, she cannot use the 30 day free card. I had them run Eliquis through the system and this was equally as expensive and the patient is unwilling to pay for this. I discussed with Dr. Rennis Golden. Ultimately we recommended the pharmacy dispense enough tablets of Xarelto to cover her through the next 3 days, and to have her call the office on Monday to obtain samples of Xarelto. (I do not have access to the office on the weekends to supply her with some.) The pharmacist will be calling her with this information. Will also forward to the prescribing team and pharmacist to determine if they have better suggestions for affordability. If we do not have samples readily available, we may need to consider Lovenox-Coumadin bridge as outpatient as the patient was on heparin in the hospital. Ronie Spies PA-C

## 2014-04-01 NOTE — Telephone Encounter (Signed)
I spoke with patient about this issue in not being able to afford one of the NOAC agents.  She tells me that she got her 30 day free card to work, so currently has a 30 days supply of xarelto 20 mg qd.  She does not want wafarin if at all possible.  It appears the plan is to cardiovert in 4 weeks, and that she is to see Dr. Shirlee Latch in the next 5-7 days for follow up (needs to be set up still).  Could try to sample patient with Xarelto enough to get her through 4 weeks post-cardioversion if needed.  She is working with her case Production designer, theatre/television/film in hopes of getting Xarelto for a lower cost.  She will call and let us know if she can't get it for a reasonable cost, and we could sample for 1 months post-cardioversion if needed, and if still requires anticoagulation at that point, she would need to change to warfarin.  To Dewayne Hatch

## 2014-04-01 NOTE — Telephone Encounter (Signed)
Spoke with pt & she will come in on 04/05/14 for a 9:15am appt with Dr. Crista Curb RN

## 2014-04-01 NOTE — Telephone Encounter (Signed)
I would like to see her this week with ECG please.

## 2014-04-01 NOTE — Telephone Encounter (Signed)
To Margorie John to set up 1 week f/u appointment for Dr. Shirlee Latch (per hospital discharge).

## 2014-04-02 ENCOUNTER — Telehealth: Payer: Self-pay

## 2014-04-02 NOTE — Telephone Encounter (Signed)
Plavix does not work for stroke prevention in atrial fibrillation.

## 2014-04-02 NOTE — Telephone Encounter (Signed)
Pt states she was thinking about changing medications but not now because she have enough Xarelto medication now. Pt was explained the difference between Xarelto and Plavix. Pt verbalized understanding.

## 2014-04-03 NOTE — Progress Notes (Signed)
ED CM received incoming call from patient concerning out of pocket cost for Xarelto. Pt has Medicare part D.  CM contacted [employer] 1800 [phone]. Spoke with [facility] Rep who informed me that patient has a $320/ year deductible, once paid patient will be responsible for a 25% co-pay for Xarelto under the [facility] Medicare Part D. Contacted patient with information. Pt verbalized understanding teach back done. No further CM needs identified.

## 2014-04-05 ENCOUNTER — Encounter: Payer: Self-pay | Admitting: Cardiology

## 2014-04-05 ENCOUNTER — Ambulatory Visit (INDEPENDENT_AMBULATORY_CARE_PROVIDER_SITE_OTHER): Payer: Medicare Other | Admitting: Cardiology

## 2014-04-05 ENCOUNTER — Encounter: Payer: Self-pay | Admitting: *Deleted

## 2014-04-05 VITALS — BP 120/60 | HR 83 | Ht 66.0 in | Wt 159.0 lb

## 2014-04-05 DIAGNOSIS — R079 Chest pain, unspecified: Secondary | ICD-10-CM

## 2014-04-05 DIAGNOSIS — I428 Other cardiomyopathies: Secondary | ICD-10-CM

## 2014-04-05 DIAGNOSIS — I4891 Unspecified atrial fibrillation: Secondary | ICD-10-CM

## 2014-04-05 DIAGNOSIS — I4729 Other ventricular tachycardia: Secondary | ICD-10-CM

## 2014-04-05 DIAGNOSIS — I429 Cardiomyopathy, unspecified: Secondary | ICD-10-CM

## 2014-04-05 DIAGNOSIS — I472 Ventricular tachycardia: Secondary | ICD-10-CM

## 2014-04-05 NOTE — Patient Instructions (Signed)
Your physician has recommended that you have a Cardioversion (DCCV). Electrical Cardioversion uses a jolt of electricity to your heart either through paddles or wired patches attached to your chest. This is a controlled, usually prescheduled, procedure. Defibrillation is done under light anesthesia in the hospital, and you usually go home the day of the procedure. This is done to get your heart back into a normal rhythm. You are not awake for the procedure. Please see the instruction sheet given to you today. July 1,2015  Your physician recommends that you schedule a follow-up appointment with Dr Shirlee Latch 2-3 weeks after the cardioversion.

## 2014-04-07 NOTE — Progress Notes (Signed)
Patient ID: Amy Adkins, female   DOB: Oct 15, 1946, 68 y.o.   MRN: [id number] PCP: Dr. 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  Her cardiac history is a bit difficult to follow.  It appears that she had an abnormal stress test 2007.  She had LHC at that time showing normal coronaries.  She had an echo in 4/11 that showed EF 45-50% with akinesis of the basal anteroseptum.  A stress Myoview in 4/12 showed EF 35% with scarring in the septum but minimal evidence for ischemia.   Patient has several cardiac symptoms.  Chronically for years, she has been getting shortness of breath and chest pressure with exertion.  This will happen with moderate exertion such as carrying a load or walking up a flight of steps.  There has been no change to this pattern.  She also has been getting frequent spells where she feels like her heart "slows down" for 30-45 seconds.  She will then feel lightheaded.  The spells pass relatively quickly.  They can occur when standing or sitting down.  She also has frequent episodes where she feels like her heart is racing.  These also make her lightheaded. 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  No recent syncope.   I had her wear a 24 hr holter in 2/15.  This showed PACs, PVCs, and a 5 beat run NSVT.  I had her increase her Coreg to 18.75 mg bid.  She did not note much difference in her palpitations since doing this.  I also had her get an echocardiogram.  This showed EF 45% with akinesis and thinning of the basal to mid anteroseptum and inferoseptum with normal RV and valves.  Given the unusual appearance of the echo, I had her do a cardiac MRI in 3/15.  This showed EF 55% with basal to mid inferoseptal and basal to mid anteroseptal akinesis/dyskinesis.  There was mid-wall delayed enhancement in the basal anteroseptum and basal inferoseptum.  She has  no history of sarcoidosis.  She has the above noted chronic dyspnea.  She had a CT chest 4/15 with no sign of sarcoidosis.   In 6/15, she was [facility] with atrial fibrillation and RVR.  We were not sure how long she had been in atrial fibrillation as she had had frequent palpitations.  She was rate controlled with diltiazem CD and Coreg.  When her HR was in the 150s, she had chest pain.  This resolved with rate control.  Cardiac enzymes were negative.  She remains in atrial fibrillation today, rate-controlled.  I offered her TEE-guided DCCV but she would rather wait for 3 wks therapeutic anticoagulation.  I have her on Xarelto.  She has had no more chest pain.  She does feel fatigued, however.  She is exhausted walking about a block.  This is new since she went into atrial fibrillation.   Labs (1/15): K 4, creatinine 1.04, LDL 96, HDL 62 Labs (6/15): K 4.2, creatinine 1.05, HCT 36.5, LDL 103  ECG: atrial fibrillation with septal Qs  PMH:  1. 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  Holter (2/15) with PACs, PVCs, 5 beat run NSVT.  3. Cardiomyopathy: Uncertain etiology.  LHC (2007) with normal coronaries.  Echo (4/11) with EF 45-50%, akinesis of the basal anteroseptum.  Stress Myoview (4/12) with scar in the  septum but minimal evidence for ischemia, EF 35%.  Echo (2/15) with EF 45%, thin/akinetic basal to mid anteroseptum and inferoseptum, basal inferior akinesis, normal RV size/systolic function, normal valvular function.  Cardiac MRI (3/15) with EF 55% with basal to mid inferoseptal and basal to mid anteroseptal akinesis/dyskinesis; there was mid-wall delayed enhancement in the basal anteroseptum and basal inferoseptum. CT chest (4/15) with no sarcoidosis.  4. Atrial fibrillation: First noted in 6/15.   SH: Divorced, TEFL teacher, nonsmoker, lives in [place].  FH: Father with MI at 58, mother with HTN, sister with myocarditis.    ROS:  All systems reviewed and negative except  as per HPI.   Current Outpatient Prescriptions  Medication Sig Dispense Refill  . carvedilol (COREG) 12.5 MG tablet Take 1.5 tablets (18.75 mg total) by mouth 2 (two) times daily with a meal.  90 tablet  6  . diltiazem (CARDIZEM CD) 120 MG 24 hr capsule Take 1 capsule (120 mg total) by mouth daily.  30 capsule  6  . gabapentin (NEURONTIN) 100 MG capsule Take 100 mg by mouth at bedtime.       Marland Kitchen lisinopril (PRINIVIL,ZESTRIL) 5 MG tablet Take 1 tablet (5 mg total) by mouth daily.  30 tablet  6  . nitroGLYCERIN (NITROSTAT) 0.4 MG SL tablet Place 1 tablet (0.4 mg total) under the tongue every 5 (five) minutes as needed for chest pain.  25 tablet  3  . rivaroxaban (XARELTO) 20 MG TABS tablet Take 1 tablet (20 mg total) by mouth daily with supper.  30 tablet  6   No current facility-administered medications for this visit.    BP 120/60  Pulse 83  Ht 5\' 6"  (1.676 m)  Wt 72.122 kg (159 lb)  BMI 25.68 kg/m2 General: NAD Neck: No JVD, no thyromegaly or thyroid nodule.  Lungs: Clear to auscultation bilaterally with normal respiratory effort. CV: Nondisplaced PMI.  Heart irregular S1/S2, no S3/S4, no murmur.  No peripheral edema.  No carotid bruit.  Normal pedal pulses.  Abdomen: Soft, nontender, no hepatosplenomegaly, no distention.  Skin: Intact without lesions or rashes.  Neurologic: Alert and oriented x 3.  Psych: Normal affect. Extremities: No clubbing or cyanosis.   Assessment/Plan: 1. Cardiomyopathy: Uncertain etiology, has been recognized for a number of years now.  LHC in 2007 with no significant CAD.  Most recent echo in 2/15 shows a rather unusual pattern of wall motion abnormalities.  She has akinesis and thinning of the basal to mid anteroseptum and inferoseptum, as well as basal inferior akinesis, overall EF 45%.  Given the unusual wall motion pattern and NSVT run noted on holter, cardiac MRI was done.  This showed EF 55% with basal to mid inferoseptal and basal to mid anteroseptal  akinesis/dyskinesis.  There was mid-wall delayed enhancement in the basal anteroseptum and basal inferoseptum. This pattern could possibly represent prior myocarditis.  However, it would also be possible for this to be cardiac sarcoidosis.  The pattern is not consistent with CAD.  She has no history of sarcoidosis and chest CT showed no sign of pulmonary sarcoid.  She has developed increased dyspnea since going into atrial fibrillation.  She is not volume overloaded on exam.  - She will continue current doses of Coreg and lisinopril.    2. Atrial fibrillation: It is possible that the palpitations she has had frequently have been paroxysms of atrial fibrillation.  She went into persistent atrial fibrillation earlier this month.  She is symptomatic.  I started her on  diltiazem CD and Xarelto 20 daily in addition to Coreg.  She decided against TEE-guided cardioversion, so I will schedule cardioversion after 3 wks of Xarelto.  After DCCV, I will start her on Multaq 400 mg bid if we can get her insurance to cover this.  3. HTN: BP is well-controlled on current regimen.  4. Chest pain: With negative cardiac enzymes in the setting of atrial fibrillation with RVR.  I will arrange for Lexiscan Cardiolite.   Marca Ancona 04/07/2014

## 2014-04-19 ENCOUNTER — Encounter (HOSPITAL_COMMUNITY): Payer: Self-pay

## 2014-04-24 ENCOUNTER — Ambulatory Visit (HOSPITAL_COMMUNITY): Payer: Medicare Other | Admitting: Anesthesiology

## 2014-04-24 ENCOUNTER — Encounter (HOSPITAL_COMMUNITY): Payer: Self-pay | Admitting: Gastroenterology

## 2014-04-24 ENCOUNTER — Encounter (HOSPITAL_COMMUNITY)
Admission: RE | Disposition: A | Discharge status: Home / Self Care | Payer: Self-pay | Source: Ambulatory Visit | Attending: Cardiology

## 2014-04-24 ENCOUNTER — Ambulatory Visit (HOSPITAL_COMMUNITY)
Admission: RE | Admit: 2014-04-24 | Discharge: 2014-04-24 | Disposition: A | Discharge status: Home / Self Care | Payer: Medicare Other | Source: Ambulatory Visit | Attending: Cardiology | Admitting: Cardiology

## 2014-04-24 ENCOUNTER — Encounter (HOSPITAL_COMMUNITY): Payer: Medicare Other | Admitting: Anesthesiology

## 2014-04-24 DIAGNOSIS — I428 Other cardiomyopathies: Secondary | ICD-10-CM | POA: Diagnosis not present

## 2014-04-24 DIAGNOSIS — I4891 Unspecified atrial fibrillation: Secondary | ICD-10-CM | POA: Insufficient documentation

## 2014-04-24 DIAGNOSIS — Z7901 Long term (current) use of anticoagulants: Secondary | ICD-10-CM | POA: Diagnosis not present

## 2014-04-24 DIAGNOSIS — I10 Essential (primary) hypertension: Secondary | ICD-10-CM | POA: Diagnosis not present

## 2014-04-24 DIAGNOSIS — R0602 Shortness of breath: Secondary | ICD-10-CM | POA: Diagnosis not present

## 2014-04-24 HISTORY — PX: CARDIOVERSION: SHX1299

## 2014-04-24 HISTORY — DX: Cardiac arrhythmia, unspecified: I49.9

## 2014-04-24 SURGERY — CARDIOVERSION
Anesthesia: Monitor Anesthesia Care

## 2014-04-24 MED ORDER — SODIUM CHLORIDE 0.9 % IV SOLN
INTRAVENOUS | Status: DC | PRN
  Administered 2014-04-24: 09:00:00 via INTRAVENOUS

## 2014-04-24 MED ORDER — SODIUM CHLORIDE 0.9 % IV SOLN
INTRAVENOUS | Status: DC
  Administered 2014-04-24: 500 mL via INTRAVENOUS

## 2014-04-24 NOTE — Transfer of Care (Signed)
Immediate Anesthesia Transfer of Care Note  Patient: Amy Adkins  Procedure(s) Performed: Procedure(s): CARDIOVERSION (N/A)  Patient Location: Endoscopy Unit  Anesthesia Type:MAC  Level of Consciousness: awake and alert   Airway & Oxygen Therapy: Patient Spontanous Breathing and Patient connected to nasal cannula oxygen  Post-op Assessment: Report given to PACU RN and Post -op Vital signs reviewed and stable  Post vital signs: Reviewed and stable  Complications: No apparent anesthesia complications

## 2014-04-24 NOTE — Anesthesia Postprocedure Evaluation (Signed)
  Anesthesia Post-op Note  Patient: Amy Adkins  Procedure(s) Performed: Procedure(s): CARDIOVERSION (N/A)  Patient Location: Endoscopy Unit  Anesthesia Type:MAC  Level of Consciousness: awake and alert   Airway and Oxygen Therapy: Patient Spontanous Breathing and Patient connected to nasal cannula oxygen  Post-op Pain: none  Post-op Assessment: Post-op Vital signs reviewed, Patient's Cardiovascular Status Stable and Respiratory Function Stable  Post-op Vital Signs: Reviewed and stable  Last Vitals:  Filed Vitals:   04/24/14 0919  BP: 153/73  Pulse:   Temp:   Resp: 16    Complications: No apparent anesthesia complications

## 2014-04-24 NOTE — Procedures (Signed)
Electrical Cardioversion Procedure Note DIMA FELLS [id number] [birth year]  Procedure: Electrical Cardioversion Indications:  Atrial Fibrillation  Procedure Details Consent: Risks of procedure as well as the alternatives and risks of each were explained to the (patient/caregiver).  Consent for procedure obtained. Time Out: Verified patient identification, verified procedure, site/side was marked, verified correct patient position, special equipment/implants available, medications/allergies/relevent history reviewed, required imaging and test results available.  Performed  Patient placed on cardiac monitor, pulse oximetry, supplemental oxygen as necessary.  Sedation given: Propofol per anesthesiology Pacer pads placed anterior and posterior chest.  Cardioverted 1 time(s).  Cardioverted at 200J.  Evaluation Findings: Post procedure EKG shows: NSR Complications: None Patient did tolerate procedure well.  Given significant symptoms when in atrial fibrillation, I will start her on Multaq 400 mg bid.    Marca Ancona 04/24/2014, 9:14 AM

## 2014-04-24 NOTE — H&P (View-Only) (Signed)
Patient ID: Amy Adkins, female   DOB: Oct 15, 1946, 68 y.o.   MRN: [id number] PCP: Dr. 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  Her cardiac history is a bit difficult to follow.  It appears that she had an abnormal stress test 2007.  She had LHC at that time showing normal coronaries.  She had an echo in 4/11 that showed EF 45-50% with akinesis of the basal anteroseptum.  A stress Myoview in 4/12 showed EF 35% with scarring in the septum but minimal evidence for ischemia.   Patient has several cardiac symptoms.  Chronically for years, she has been getting shortness of breath and chest pressure with exertion.  This will happen with moderate exertion such as carrying a load or walking up a flight of steps.  There has been no change to this pattern.  She also has been getting frequent spells where she feels like her heart "slows down" for 30-45 seconds.  She will then feel lightheaded.  The spells pass relatively quickly.  They can occur when standing or sitting down.  She also has frequent episodes where she feels like her heart is racing.  These also make her lightheaded. 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  No recent syncope.   I had her wear a 24 hr holter in 2/15.  This showed PACs, PVCs, and a 5 beat run NSVT.  I had her increase her Coreg to 18.75 mg bid.  She did not note much difference in her palpitations since doing this.  I also had her get an echocardiogram.  This showed EF 45% with akinesis and thinning of the basal to mid anteroseptum and inferoseptum with normal RV and valves.  Given the unusual appearance of the echo, I had her do a cardiac MRI in 3/15.  This showed EF 55% with basal to mid inferoseptal and basal to mid anteroseptal akinesis/dyskinesis.  There was mid-wall delayed enhancement in the basal anteroseptum and basal inferoseptum.  She has  no history of sarcoidosis.  She has the above noted chronic dyspnea.  She had a CT chest 4/15 with no sign of sarcoidosis.   In 6/15, she was admitted to [facility] with atrial fibrillation and RVR.  We were not sure how long she had been in atrial fibrillation as she had had frequent palpitations.  She was rate controlled with diltiazem CD and Coreg.  When her HR was in the 150s, she had chest pain.  This resolved with rate control.  Cardiac enzymes were negative.  She remains in atrial fibrillation today, rate-controlled.  I offered her TEE-guided DCCV but she would rather wait for 3 wks therapeutic anticoagulation.  I have her on Xarelto.  She has had no more chest pain.  She does feel fatigued, however.  She is exhausted walking about a block.  This is new since she went into atrial fibrillation.   Labs (1/15): K 4, creatinine 1.04, LDL 96, HDL 62 Labs (6/15): K 4.2, creatinine 1.05, HCT 36.5, LDL 103  ECG: atrial fibrillation with septal Qs  PMH:  1. 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  Holter (2/15) with PACs, PVCs, 5 beat run NSVT.  3. Cardiomyopathy: Uncertain etiology.  LHC (2007) with normal coronaries.  Echo (4/11) with EF 45-50%, akinesis of the basal anteroseptum.  Stress Myoview (4/12) with scar in the  septum but minimal evidence for ischemia, EF 35%.  Echo (2/15) with EF 45%, thin/akinetic basal to mid anteroseptum and inferoseptum, basal inferior akinesis, normal RV size/systolic function, normal valvular function.  Cardiac MRI (3/15) with EF 55% with basal to mid inferoseptal and basal to mid anteroseptal akinesis/dyskinesis; there was mid-wall delayed enhancement in the basal anteroseptum and basal inferoseptum. CT chest (4/15) with no sarcoidosis.  4. Atrial fibrillation: First noted in 6/15.   SH: Divorced, TEFL teacher, nonsmoker, lives in [place].  FH: Father with MI at 58, mother with HTN, sister with myocarditis.    ROS:  All systems reviewed and negative except  as per HPI.   Current Outpatient Prescriptions  Medication Sig Dispense Refill  . carvedilol (COREG) 12.5 MG tablet Take 1.5 tablets (18.75 mg total) by mouth 2 (two) times daily with a meal.  90 tablet  6  . diltiazem (CARDIZEM CD) 120 MG 24 hr capsule Take 1 capsule (120 mg total) by mouth daily.  30 capsule  6  . gabapentin (NEURONTIN) 100 MG capsule Take 100 mg by mouth at bedtime.       Marland Kitchen lisinopril (PRINIVIL,ZESTRIL) 5 MG tablet Take 1 tablet (5 mg total) by mouth daily.  30 tablet  6  . nitroGLYCERIN (NITROSTAT) 0.4 MG SL tablet Place 1 tablet (0.4 mg total) under the tongue every 5 (five) minutes as needed for chest pain.  25 tablet  3  . rivaroxaban (XARELTO) 20 MG TABS tablet Take 1 tablet (20 mg total) by mouth daily with supper.  30 tablet  6   No current facility-administered medications for this visit.    BP 120/60  Pulse 83  Ht 5\' 6"  (1.676 m)  Wt 72.122 kg (159 lb)  BMI 25.68 kg/m2 General: NAD Neck: No JVD, no thyromegaly or thyroid nodule.  Lungs: Clear to auscultation bilaterally with normal respiratory effort. CV: Nondisplaced PMI.  Heart irregular S1/S2, no S3/S4, no murmur.  No peripheral edema.  No carotid bruit.  Normal pedal pulses.  Abdomen: Soft, nontender, no hepatosplenomegaly, no distention.  Skin: Intact without lesions or rashes.  Neurologic: Alert and oriented x 3.  Psych: Normal affect. Extremities: No clubbing or cyanosis.   Assessment/Plan: 1. Cardiomyopathy: Uncertain etiology, has been recognized for a number of years now.  LHC in 2007 with no significant CAD.  Most recent echo in 2/15 shows a rather unusual pattern of wall motion abnormalities.  She has akinesis and thinning of the basal to mid anteroseptum and inferoseptum, as well as basal inferior akinesis, overall EF 45%.  Given the unusual wall motion pattern and NSVT run noted on holter, cardiac MRI was done.  This showed EF 55% with basal to mid inferoseptal and basal to mid anteroseptal  akinesis/dyskinesis.  There was mid-wall delayed enhancement in the basal anteroseptum and basal inferoseptum. This pattern could possibly represent prior myocarditis.  However, it would also be possible for this to be cardiac sarcoidosis.  The pattern is not consistent with CAD.  She has no history of sarcoidosis and chest CT showed no sign of pulmonary sarcoid.  She has developed increased dyspnea since going into atrial fibrillation.  She is not volume overloaded on exam.  - She will continue current doses of Coreg and lisinopril.    2. Atrial fibrillation: It is possible that the palpitations she has had frequently have been paroxysms of atrial fibrillation.  She went into persistent atrial fibrillation earlier this month.  She is symptomatic.  I started her on  diltiazem CD and Xarelto 20 daily in addition to Coreg.  She decided against TEE-guided cardioversion, so I will schedule cardioversion after 3 wks of Xarelto.  After DCCV, I will start her on Multaq 400 mg bid if we can get her insurance to cover this.  3. HTN: BP is well-controlled on current regimen.  4. Chest pain: With negative cardiac enzymes in the setting of atrial fibrillation with RVR.  I will arrange for Lexiscan Cardiolite.   Marca Ancona 04/07/2014

## 2014-04-24 NOTE — Interval H&P Note (Signed)
History and Physical Interval Note:  04/24/2014 9:09 AM  Amy Adkins  has presented today for surgery, with the diagnosis of AFib  The various methods of treatment have been discussed with the patient and family. After consideration of risks, benefits and other options for treatment, the patient has consented to  Procedure(s): CARDIOVERSION (N/A) as a surgical intervention .  The patient's history has been reviewed, patient examined, no change in status, stable for surgery.  I have reviewed the patient's chart and labs.  Questions were answered to the patient's satisfaction.     Dalton [employer]

## 2014-04-24 NOTE — Anesthesia Preprocedure Evaluation (Addendum)
Anesthesia Evaluation  Patient identified by MRN, date of birth, ID band Patient awake    Reviewed: Allergy & Precautions, H&P   Airway Mallampati: I TM Distance: >3 FB Neck ROM: Full    Dental  (+) Teeth Intact, Dental Advisory Given   Pulmonary shortness of breath and with exertion,          Cardiovascular hypertension, Pt. on home beta blockers + dysrhythmias (s/p SVT ablation) Atrial Fibrillation and Supra Ventricular Tachycardia  Cardiomyopathy of unknown etiology EF 35% On xarelto   Neuro/Psych    GI/Hepatic   Endo/Other    Renal/GU      Musculoskeletal   Abdominal   Peds  Hematology   Anesthesia Other Findings   Reproductive/Obstetrics                         Anesthesia Physical Anesthesia Plan  ASA: III  Anesthesia Plan: MAC   Post-op Pain Management:    Induction: Intravenous  Airway Management Planned: Mask  Additional Equipment:   Intra-op Plan:   Post-operative Plan:   Informed Consent: I have reviewed the patients History and Physical, chart, labs and discussed the procedure including the risks, benefits and alternatives for the proposed anesthesia with the patient or authorized representative who has indicated his/her understanding and acceptance.     Plan Discussed with: CRNA and Anesthesiologist  Anesthesia Plan Comments:         Anesthesia Quick Evaluation

## 2014-04-25 ENCOUNTER — Telehealth: Payer: Self-pay | Admitting: Cardiology

## 2014-04-25 ENCOUNTER — Encounter (HOSPITAL_COMMUNITY): Payer: Self-pay | Admitting: Cardiology

## 2014-04-25 NOTE — Telephone Encounter (Signed)
Will forward to Dr McLean for review. 

## 2014-04-25 NOTE — Telephone Encounter (Signed)
LMTCB

## 2014-04-25 NOTE — Telephone Encounter (Signed)
She is not candidate for flecainide with structural heart disease.  Would avoid amiodarone at her age.  Sotalol and Tikosyn would require hospitalization to initiate.  If Multaq is too expensive, let's see if atrial fibrillation recurs.  If it does, will bring her into the hospital to initiate either Tikosyn or sotalol.

## 2014-04-25 NOTE — Telephone Encounter (Signed)
Pt was given a prescription for Multaq 400mg  bid yesterday.  Her out of pocket expense for this is more than $300. She is asking for less expensive alternative.

## 2014-04-25 NOTE — Telephone Encounter (Signed)
Pt.notified

## 2014-04-25 NOTE — Telephone Encounter (Signed)
Follow up ° ° ° ° ° ° ° ° ° °Pt returning nurse call  °

## 2014-04-25 NOTE — Telephone Encounter (Signed)
New message           Anti arrhythmic medication is very expensive / Is there something else I could take?

## 2014-05-09 ENCOUNTER — Encounter: Payer: Self-pay | Admitting: *Deleted

## 2014-05-09 ENCOUNTER — Other Ambulatory Visit: Payer: Self-pay | Admitting: *Deleted

## 2014-05-16 ENCOUNTER — Ambulatory Visit (INDEPENDENT_AMBULATORY_CARE_PROVIDER_SITE_OTHER): Payer: Medicare Other | Admitting: Cardiology

## 2014-05-16 ENCOUNTER — Encounter: Payer: Self-pay | Admitting: Cardiology

## 2014-05-16 VITALS — BP 148/84 | HR 55 | Ht 66.0 in | Wt 165.0 lb

## 2014-05-16 DIAGNOSIS — I480 Paroxysmal atrial fibrillation: Secondary | ICD-10-CM

## 2014-05-16 DIAGNOSIS — I428 Other cardiomyopathies: Secondary | ICD-10-CM

## 2014-05-16 DIAGNOSIS — I4891 Unspecified atrial fibrillation: Secondary | ICD-10-CM

## 2014-05-16 DIAGNOSIS — I429 Cardiomyopathy, unspecified: Secondary | ICD-10-CM

## 2014-05-16 NOTE — Patient Instructions (Signed)
Your physician recommends that you have  lab work today--CBCd  Your physician wants you to follow-up in: 6 months with Dr Shirlee Latch. (January 2016).  You will receive a reminder letter in the mail two months in advance. If you don't receive a letter, please call our office to schedule the follow-up appointment.

## 2014-05-17 LAB — CBC WITH DIFFERENTIAL/PLATELET
BASOS ABS: 0 10*3/uL (ref 0.0–0.1)
Basophils Relative: 0.2 % (ref 0.0–3.0)
EOS ABS: 0.1 10*3/uL (ref 0.0–0.7)
Eosinophils Relative: 1.5 % (ref 0.0–5.0)
HCT: 35.5 % — ABNORMAL LOW (ref 36.0–46.0)
HEMOGLOBIN: 11.7 g/dL — AB (ref 12.0–15.0)
LYMPHS ABS: 1.8 10*3/uL (ref 0.7–4.0)
Lymphocytes Relative: 29.1 % (ref 12.0–46.0)
MCHC: 32.9 g/dL (ref 30.0–36.0)
MCV: 79.9 fl (ref 78.0–100.0)
Monocytes Absolute: 0.5 10*3/uL (ref 0.1–1.0)
Monocytes Relative: 8.5 % (ref 3.0–12.0)
Neutro Abs: 3.8 10*3/uL (ref 1.4–7.7)
Neutrophils Relative %: 60.7 % (ref 43.0–77.0)
PLATELETS: 219 10*3/uL (ref 150.0–400.0)
RBC: 4.44 Mil/uL (ref 3.87–5.11)
RDW: 14 % (ref 11.5–15.5)
WBC: 6.3 10*3/uL (ref 4.0–10.5)

## 2014-05-18 NOTE — Progress Notes (Signed)
Patient ID: Amy Adkins, female   DOB: 05-09-1946, 68 y.o.   MRN: [id number] PCP: Dr. Maurice Small  68 yo with history of AVNRT s/p ablation in [place] and mild cardiomyopathy presents for cardiology followup.  Her cardiac history is a bit difficult to follow.  It appears that she had an abnormal stress test 2007.  She had LHC at that time showing normal coronaries.  She had an echo in 4/11 that showed EF 45-50% with akinesis of the basal anteroseptum.  A stress Myoview in 4/12 showed EF 35% with scarring in the septum but minimal evidence for ischemia.   Patient has several cardiac symptoms.  Chronically for years, she has been getting shortness of breath and chest pressure with exertion.  This will happen with moderate exertion such as carrying a load or walking up a flight of steps.  There has been no change to this pattern.  She also has been getting frequent spells where she feels like her heart "slows down" for 30-45 seconds.  She will then feel lightheaded.  The spells pass relatively quickly.  They can occur when standing or sitting down.  She also has frequent episodes where she feels like her heart is racing.  These also make her lightheaded. 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  No recent syncope.   I had her wear a 24 hr holter in 2/15.  This showed PACs, PVCs, and a 5 beat run NSVT.  I had her increase her Coreg to 18.75 mg bid.  She did not note much difference in her palpitations since doing this.  I also had her get an echocardiogram.  This showed EF 45% with akinesis and thinning of the basal to mid anteroseptum and inferoseptum with normal RV and valves.  Given the unusual appearance of the echo, I had her do a cardiac MRI in 3/15.  This showed EF 55% with basal to mid inferoseptal and basal to mid anteroseptal akinesis/dyskinesis.  There was mid-wall delayed enhancement in the basal anteroseptum and basal inferoseptum.  She has  no history of sarcoidosis.  She has the above noted chronic dyspnea.  She had a CT chest 4/15 with no sign of sarcoidosis.   In 6/15, she was admitted to [facility] with atrial fibrillation and RVR.  We were not sure how long she had been in atrial fibrillation as she had had frequent palpitations.  She was rate controlled with diltiazem CD and Coreg.  When her HR was in the 150s, she had chest pain.  This resolved with rate control.  Cardiac enzymes were negative.  I started her on Xarelto and cardioverted her in 7/15.  She remains in NSR today.  I had wanted her to take dronedarone, but it was too expensive.  She feels much better in NSR.  No exertional dyspnea, no chest pain.  No orthopnea or PND.  She recently tripped and fell down her stairs.  She had a left shoulder hemarthrosis, but this has improved.   Labs (1/15): K 4, creatinine 1.04, LDL 96, HDL 62 Labs (6/15): K 4.2, creatinine 1.05, HCT 36.5, LDL 103  ECG: NSR with septal Qs  PMH:  1. 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  Holter (2/15) with PACs, PVCs, 5 beat run NSVT.  3. Cardiomyopathy: Uncertain etiology.  LHC (2007) with normal coronaries.  Echo (4/11) with EF 45-50%, akinesis of the basal anteroseptum.  Stress  Myoview (4/12) with scar in the septum but minimal evidence for ischemia, EF 35%.  Echo (2/15) with EF 45%, thin/akinetic basal to mid anteroseptum and inferoseptum, basal inferior akinesis, normal RV size/systolic function, normal valvular function.  Cardiac MRI (3/15) with EF 55% with basal to mid inferoseptal and basal to mid anteroseptal akinesis/dyskinesis; there was mid-wall delayed enhancement in the basal anteroseptum and basal inferoseptum. CT chest (4/15) with no sarcoidosis.  4. Atrial fibrillation: First noted in 6/15.  DCCV in 7/15.  SH: Divorced, retired TEFL teachersurgical tech, nonsmoker, lives in [place].  FH: Father with MI at 3652, mother with HTN, sister with myocarditis.    ROS:  All systems reviewed and  negative except as per HPI.   Current Outpatient Prescriptions  Medication Sig Dispense Refill  . carvedilol (COREG) 12.5 MG tablet Take 18.75 mg by mouth 2 (two) times daily with a meal.      . diltiazem (CARDIZEM CD) 120 MG 24 hr capsule Take 1 capsule (120 mg total) by mouth daily.  30 capsule  6  . lisinopril (PRINIVIL,ZESTRIL) 5 MG tablet Take 5 mg by mouth daily.      . nitroGLYCERIN (NITROSTAT) 0.4 MG SL tablet Place 1 tablet (0.4 mg total) under the tongue every 5 (five) minutes as needed for chest pain.  25 tablet  3  . rivaroxaban (XARELTO) 20 MG TABS tablet Take 20 mg by mouth daily with supper.       No current facility-administered medications for this visit.    BP 148/84  Pulse 55  Ht 5\' 6"  (1.676 m)  Wt 165 lb (74.844 kg)  BMI 26.64 kg/m2 General: NAD Neck: No JVD, no thyromegaly or thyroid nodule.  Lungs: Clear to auscultation bilaterally with normal respiratory effort. CV: Nondisplaced PMI.  Heart irregular S1/S2, no S3/S4, no murmur.  No peripheral edema.  No carotid bruit.  Normal pedal pulses.  Abdomen: Soft, nontender, no hepatosplenomegaly, no distention.  Skin: Intact without lesions or rashes.  Neurologic: Alert and oriented x 3.  Psych: Normal affect. Extremities: No clubbing or cyanosis.   Assessment/Plan: 1. Cardiomyopathy: Uncertain etiology, has been recognized for a number of years now.  LHC in 2007 with no significant CAD.  Most recent echo in 2/15 shows a rather unusual pattern of wall motion abnormalities.  She has akinesis and thinning of the basal to mid anteroseptum and inferoseptum, as well as basal inferior akinesis, overall EF 45%.  Given the unusual wall motion pattern and NSVT run noted on holter, cardiac MRI was done.  This showed EF 55% with basal to mid inferoseptal and basal to mid anteroseptal akinesis/dyskinesis.  There was mid-wall delayed enhancement in the basal anteroseptum and basal inferoseptum. This pattern could possibly represent  prior myocarditis.  However, it would also be possible for this to be cardiac sarcoidosis.  The pattern is not consistent with CAD.  She has no history of sarcoidosis and chest CT showed no sign of pulmonary sarcoid.  She is doing well currently with no exertional dyspnea. - She will continue current doses of Coreg and lisinopril.    2. Atrial fibrillation: It is possible that the palpitations she has had frequently have been paroxysms of atrial fibrillation.  She went into persistent atrial fibrillation in 6/15.  She was very symptomatic.  I started her on diltiazem CD and Xarelto 20 daily in addition to Coreg.  She was cardioverted to NSR in 7/15.  I had wanted to start her on Multaq but it was too  expensive.  Therefore, she is not on an anti-arrhythmic currently.  3. HTN: BP is well-controlled on current regimen.  4. Chest pain: With negative cardiac enzymes in the setting of atrial fibrillation with RVR.  No further chest pain and she is generally active.  I will hold off on Cardiolite for now.    Marca Ancona 05/18/2014

## 2014-05-20 ENCOUNTER — Telehealth: Payer: Self-pay | Admitting: Cardiology

## 2014-05-20 NOTE — Telephone Encounter (Signed)
Spoke with patient about recent lab results 

## 2014-05-20 NOTE — Telephone Encounter (Signed)
New message  ° ° °Returning call back to nurse.  °

## 2014-06-10 ENCOUNTER — Telehealth: Payer: Self-pay | Admitting: Cardiology

## 2014-06-10 MED ORDER — APIXABAN 5 MG PO TABS: 5.0000 mg | ORAL_TABLET | Freq: Two times a day (BID) | ORAL | Status: DC

## 2014-06-10 NOTE — Telephone Encounter (Signed)
Pt advised,verbalized understanding. 

## 2014-06-10 NOTE — Telephone Encounter (Signed)
New message    Patient calling back to discuss medication,. Nothing urgent.

## 2014-06-10 NOTE — Telephone Encounter (Signed)
Pt would like to change from Xarelto to Eliquis because it is less expensive. as long as Dr Shirlee Latch thinks it will be just Pt does not want to change if Dr Shirlee Latch does not think Eliquis will be as effective as Xarelto.   Pt aware I am forwarding to Dr Shirlee Latch for review and recommendations.

## 2014-06-10 NOTE — Telephone Encounter (Signed)
Ok to make the change to Eliquis 5 mg bid.

## 2014-06-10 NOTE — Telephone Encounter (Signed)
Pt knows to start Eliquis when she is due to take her next dose of Xarelto.

## 2014-06-12 ENCOUNTER — Telehealth: Payer: Self-pay | Admitting: Cardiology

## 2014-06-12 NOTE — Telephone Encounter (Signed)
Pt notified Xarelto 20mg  #10 samples left at desk for pick up.

## 2014-06-12 NOTE — Telephone Encounter (Signed)
New message     Pt want to know if she can come by and get 7 xeralto tablets to last until she start her eliquis on sept. 1.  Refill dept could not determine if this was true so message sent to nurse

## 2014-06-13 ENCOUNTER — Other Ambulatory Visit: Payer: Self-pay

## 2014-06-13 MED ORDER — APIXABAN 5 MG PO TABS
5.0000 mg | ORAL_TABLET | Freq: Two times a day (BID) | ORAL | Status: AC
Start: 2014-06-13 — End: ?

## 2014-08-28 ENCOUNTER — Telehealth: Payer: Self-pay | Admitting: Cardiology

## 2014-08-28 NOTE — Telephone Encounter (Signed)
New message ° ° ° ° ° ° °Talk to the nurse-----pt would not tell me what she wanted °

## 2014-08-28 NOTE — Telephone Encounter (Signed)
Pt calling because she has moved to [place], [place] and she wants Dr Shirlee Latch to know. She is in the process of getting established with a cardiologist there.

## 2014-08-28 NOTE — Telephone Encounter (Signed)
Pt advised.

## 2014-08-28 NOTE — Telephone Encounter (Signed)
Tell her that I hope the move goes well and that she needs to establish a cardiologist there as soon as she can so that someone will be available in case of problems.  I can send her records down to the cardiologist of her choice.

## 2014-10-10 IMAGING — CT CT CHEST W/ CM
2 of 4 series · 15 of 36 positions shown, 18 images · IV contrast (Omnipaque 300)
Comparison: Chest x-ray 02/06/2014.

CLINICAL DATA: Question sarcoidosis.

EXAM:
CT CHEST WITH CONTRAST
TECHNIQUE: Multidetector CT imaging of the chest was performed during
intravenous contrast administration.
CONTRAST:  80mL OMNIPAQUE IOHEXOL 300 MG/ML  SOLN

[Series 2: chest routine with · axial · 0.65mm/px · z∈[-287,-57]mm · 12 of 56 slices shown, 15 images]
[im 5/56  mediastinal]
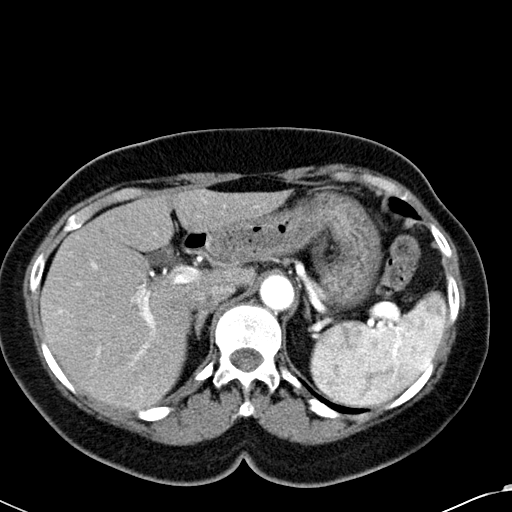
[im 5/56  lung]
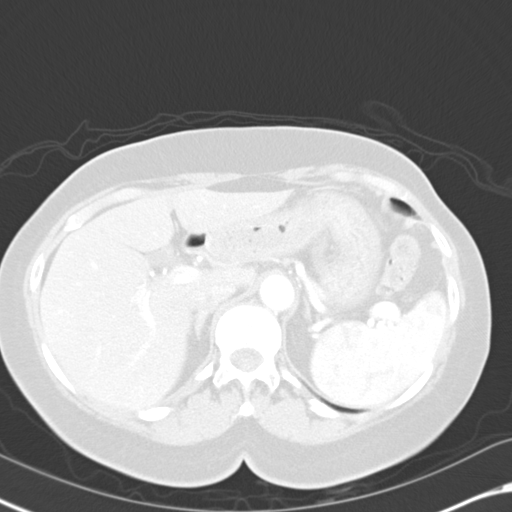
[im 9/56  lung]
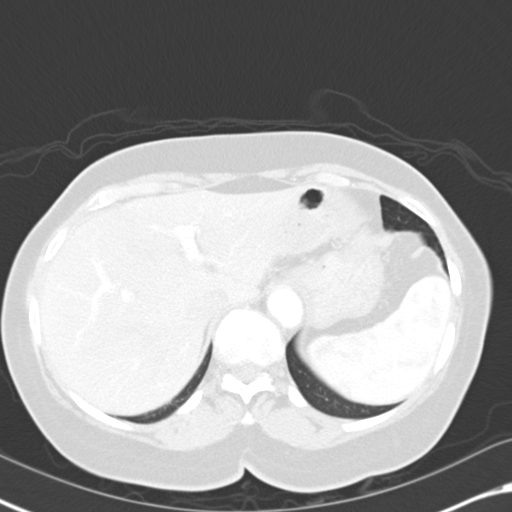
[im 13/56  lung]
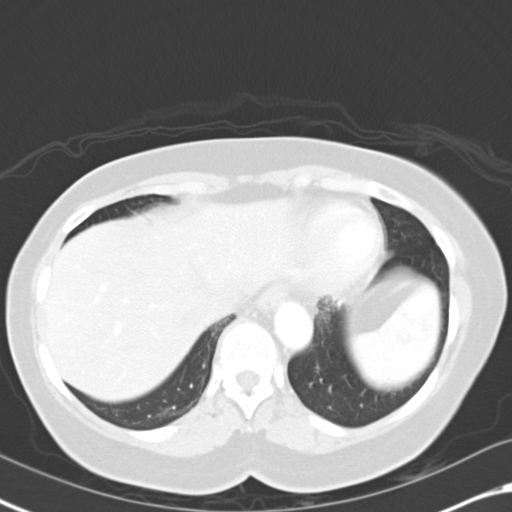
[im 17/56  lung]
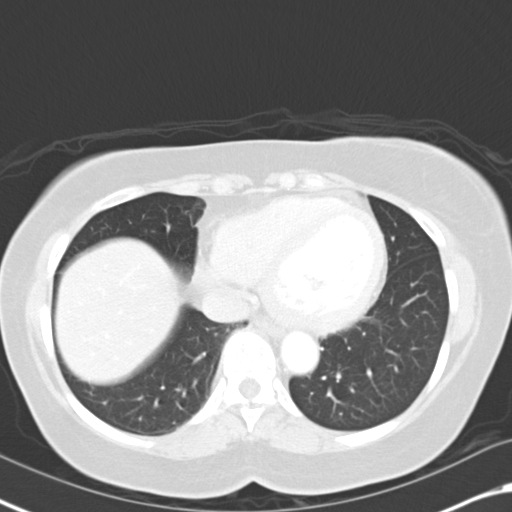
[im 22/56  mediastinal]
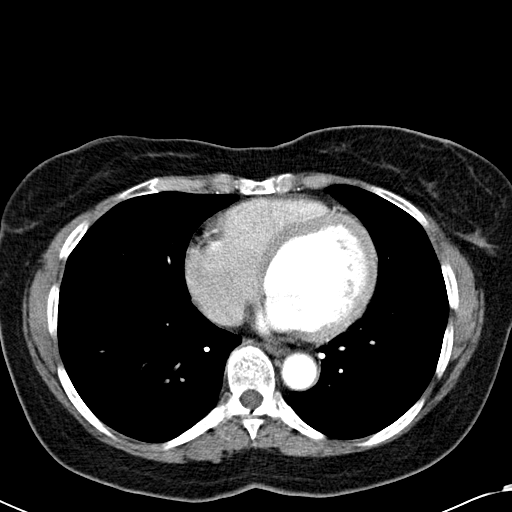
[im 22/56  lung]
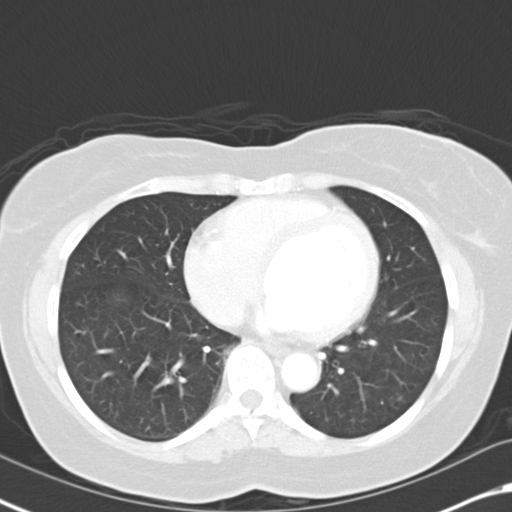
[im 26/56  lung]
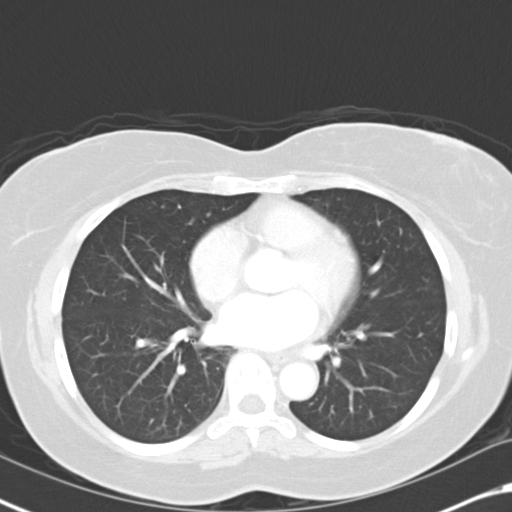
[im 30/56  lung]
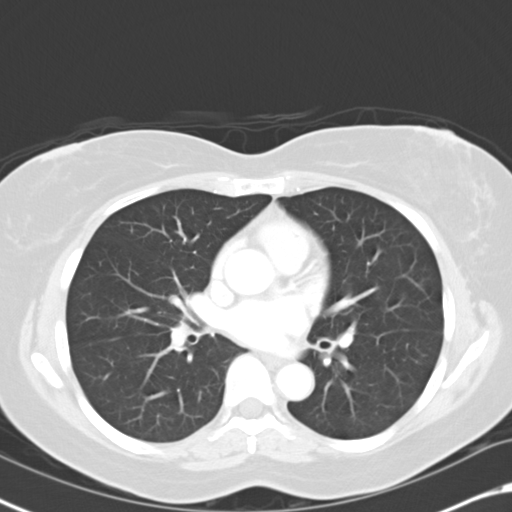
[im 34/56  lung]
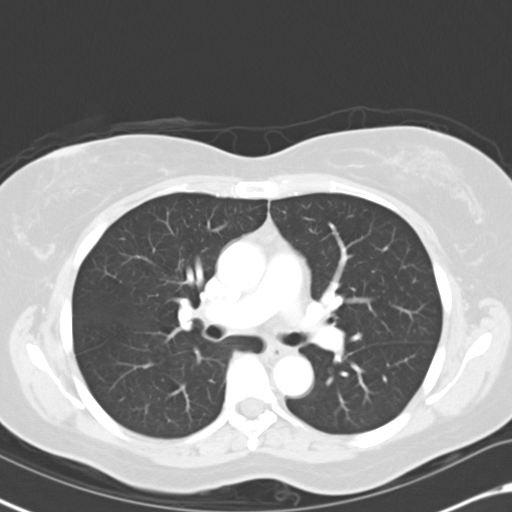
[im 39/56  mediastinal]
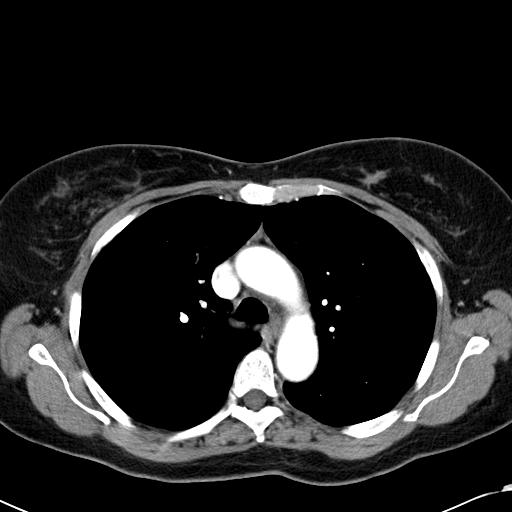
[im 39/56  lung]
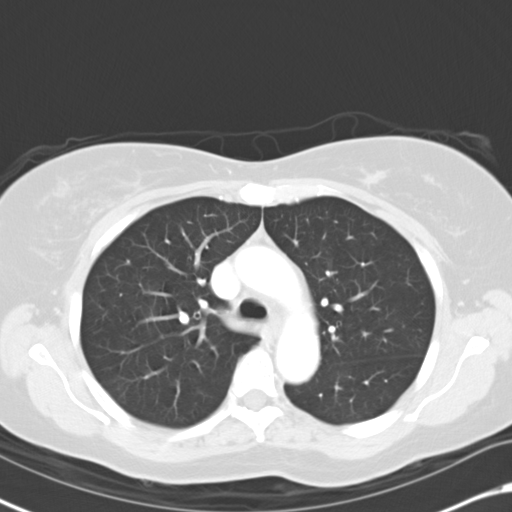
[im 43/56  lung]
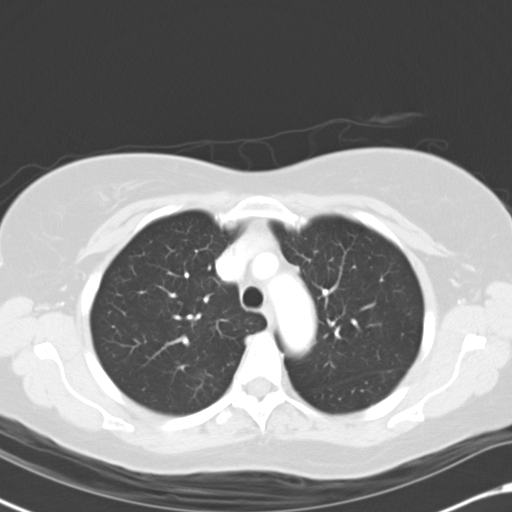
[im 47/56  lung]
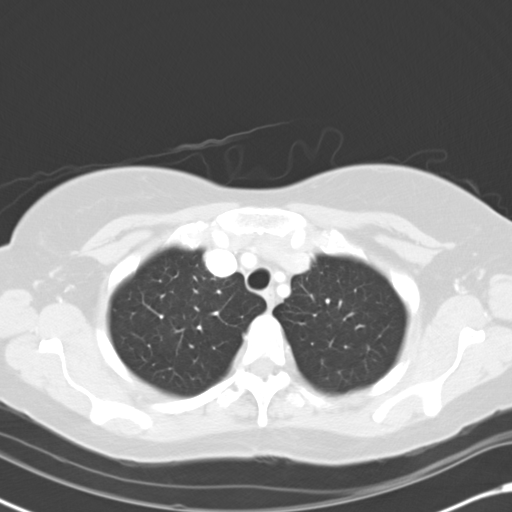
[im 51/56  lung]
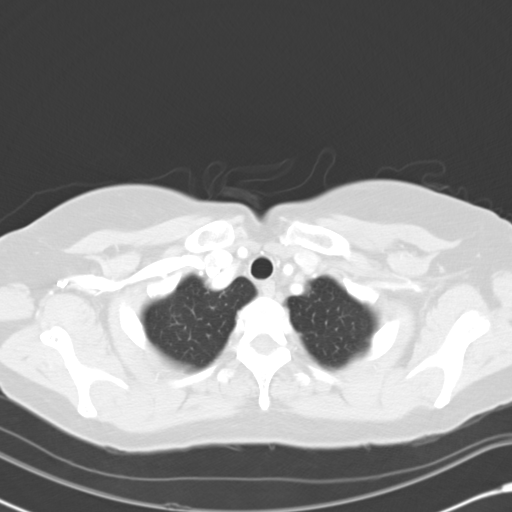

[Series 602: cor · coronal · 0.65mm/px · 3 of 92 slices shown]
[im 19/92  lung]
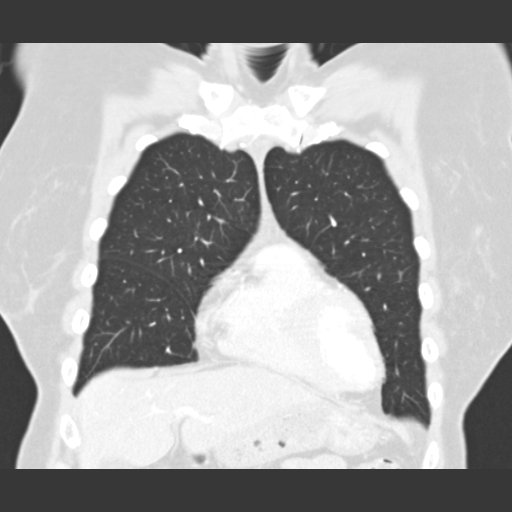
[im 37/92  lung]
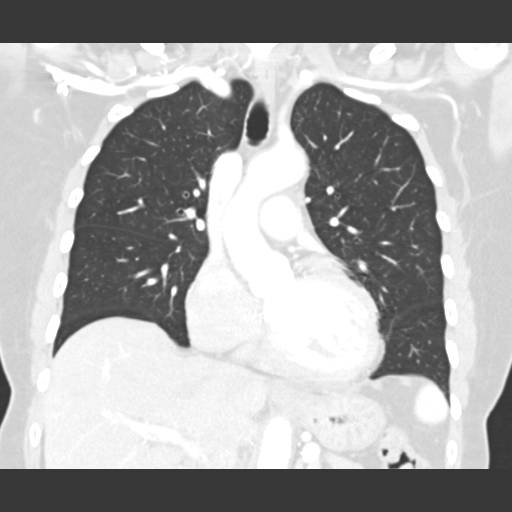
[im 55/92  lung]
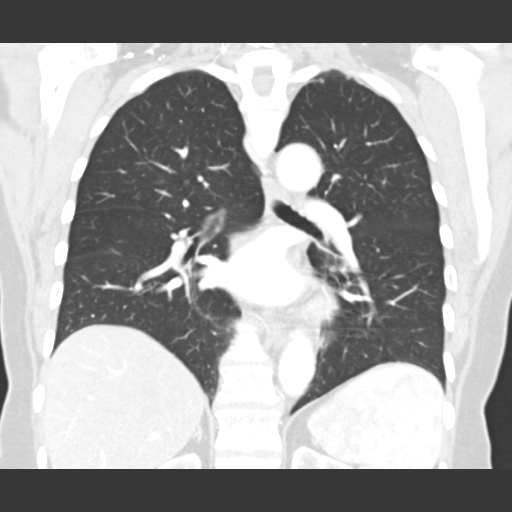

[15 of 36 positions shown; findings below may reference images not displayed]

FINDINGS: Heart is normal size. Aorta is normal caliber. No visible coronary
artery calcifications. No mediastinal, hilar, or axillary
adenopathy. Chest wall soft tissues are unremarkable. Lungs are
clear. No focal airspace opacities or suspicious nodules. No
effusions. Imaging into the upper abdomen shows no acute findings.

No acute bony abnormality or focal bone lesion.
IMPRESSION: No acute findings within the chest.  No CT evidence of sarcoidosis.

## 2015-01-26 ENCOUNTER — Other Ambulatory Visit: Payer: Self-pay | Admitting: Nurse Practitioner

## 2015-01-27 NOTE — Telephone Encounter (Signed)
Patient has moved to [place] and was supposed to establish with a cardiologist there. Ok to refill? Please advise. Thanks, MI

## 2015-04-04 ENCOUNTER — Other Ambulatory Visit: Payer: Self-pay | Admitting: Nurse Practitioner
